# Patient Record
Sex: Female | Born: 1950 | Race: Black or African American | Hispanic: No | Marital: Married | Smoking: Never smoker
Health system: Southern US, Community
[De-identification: ages and names within clinical notes are randomized; demographics above are authoritative.]

## PROBLEM LIST (undated history)

## (undated) DIAGNOSIS — I10 Essential (primary) hypertension: Secondary | ICD-10-CM

## (undated) DIAGNOSIS — E119 Type 2 diabetes mellitus without complications: Secondary | ICD-10-CM

## (undated) DIAGNOSIS — E785 Hyperlipidemia, unspecified: Secondary | ICD-10-CM

## (undated) DIAGNOSIS — M1990 Unspecified osteoarthritis, unspecified site: Secondary | ICD-10-CM

## (undated) HISTORY — PX: ABDOMINAL HYSTERECTOMY: SHX81

## (undated) HISTORY — DX: Type 2 diabetes mellitus without complications: E11.9

## (undated) HISTORY — DX: Essential (primary) hypertension: I10

## (undated) HISTORY — DX: Hyperlipidemia, unspecified: E78.5

---

## 1998-01-21 ENCOUNTER — Other Ambulatory Visit: Admission: RE | Admit: 1998-01-21 | Discharge: 1998-01-21 | Payer: Self-pay | Admitting: Obstetrics

## 1999-01-15 ENCOUNTER — Other Ambulatory Visit: Admission: RE | Admit: 1999-01-15 | Discharge: 1999-01-15 | Payer: Self-pay | Admitting: Obstetrics

## 1999-07-23 ENCOUNTER — Encounter: Payer: Self-pay | Admitting: Family Medicine

## 1999-07-23 ENCOUNTER — Ambulatory Visit (HOSPITAL_COMMUNITY): Admission: RE | Admit: 1999-07-23 | Discharge: 1999-07-23 | Payer: Self-pay | Admitting: Family Medicine

## 1999-11-09 ENCOUNTER — Encounter: Payer: Self-pay | Admitting: Obstetrics

## 1999-11-09 ENCOUNTER — Encounter: Admission: RE | Admit: 1999-11-09 | Discharge: 1999-11-09 | Payer: Self-pay | Admitting: Obstetrics

## 2001-01-12 ENCOUNTER — Encounter: Admission: RE | Admit: 2001-01-12 | Discharge: 2001-01-12 | Payer: Self-pay | Admitting: Family Medicine

## 2001-01-12 ENCOUNTER — Encounter: Payer: Self-pay | Admitting: Family Medicine

## 2003-01-23 ENCOUNTER — Encounter: Payer: Self-pay | Admitting: Family Medicine

## 2003-01-23 ENCOUNTER — Encounter: Admission: RE | Admit: 2003-01-23 | Discharge: 2003-01-23 | Payer: Self-pay | Admitting: Family Medicine

## 2004-08-17 ENCOUNTER — Ambulatory Visit (HOSPITAL_COMMUNITY): Admission: RE | Admit: 2004-08-17 | Discharge: 2004-08-17 | Payer: Self-pay | Admitting: Gastroenterology

## 2004-09-08 ENCOUNTER — Encounter: Admission: RE | Admit: 2004-09-08 | Discharge: 2004-09-08 | Payer: Self-pay | Admitting: Family Medicine

## 2006-01-19 ENCOUNTER — Encounter: Admission: RE | Admit: 2006-01-19 | Discharge: 2006-01-19 | Payer: Self-pay | Admitting: Family Medicine

## 2007-01-23 ENCOUNTER — Encounter: Admission: RE | Admit: 2007-01-23 | Discharge: 2007-01-23 | Payer: Self-pay | Admitting: Family Medicine

## 2007-09-05 ENCOUNTER — Encounter: Admission: RE | Admit: 2007-09-05 | Discharge: 2007-09-05 | Payer: Self-pay | Admitting: Sports Medicine

## 2010-01-07 ENCOUNTER — Encounter: Admission: RE | Admit: 2010-01-07 | Discharge: 2010-01-07 | Payer: Self-pay | Admitting: Family Medicine

## 2010-12-11 NOTE — Op Note (Signed)
NAMECOLLIN, Shelby Duke                 ACCOUNT NO.:  [id number]   MEDICAL RECORD NO.:  [id number]          PATIENT TYPE:  AMB   LOCATION:  ENDO                         FACILITY:  [facility]   PHYSICIAN:  Anselmo Rod, M.D.  DATE OF BIRTH:  29-May-1951   DATE OF PROCEDURE:  08/17/2004  DATE OF DISCHARGE:                                 OPERATIVE REPORT   PROCEDURE PERFORMED:  Screening colonoscopy.   ENDOSCOPIST:  Anselmo Rod, M.D.   INSTRUMENT USED:  Olympus video colonoscope.   INDICATIONS FOR PROCEDURE:  A 60 year old white female underwent screening  colonoscopy to rule out colonic polyps, masses, etc.   PREPROCEDURE PREPARATION:  Informed consent was procured from the patient.  The risks and benefits of the procedure including a 10% missed rate of  cancer and polyps were discussed with the patient.  The patient was prepped  with a bottle of magnesium citrate and a gallon of GoLYTELY the night prior  to the procedure.   PREPROCEDURE PHYSICAL:  VITAL SIGNS:  The patient had stable vital signs.  NECK:  Supple.  CHEST: Clear to auscultation.  S1 and S2 regular.  ABDOMEN: Soft with normal bowel sounds.  The patient had an obese abdomen  with a midline surgical scar present at the umbilicus from previous  hysterectomy.   DESCRIPTION OF PROCEDURE:  The patient was placed in the left lateral  decubitus position, sedated with 50 mg of Demerol and 5 mg of Versed in slow  incremental doses.  Once the patient was adequately sedated and maintained  on low flow oxygen and continuous cardiac monitoring, the Olympus video  colonoscope was advanced from the rectum to the cecum with difficulty.  The  patient had a very tortuous colon.  The patient's position was changed from  the left lateral to the supine and the right lateral position and again back  onto the left lateral side to reach the cecum with gentle application of  abdominal pressure.  No masses, polyps, erosions, ulcerations, or  diverticula were seen.  The appendiceal orifice and the ileocecal valve were  clearly visualized and photographed.  The patient tolerated the procedure  well without complications. Retroflexion in the rectum revealed no  abnormalities.   IMPRESSION:  Tortuous colon otherwise unrevealing colonic mucosa.  No  masses, polyps or diverticula seen.   RECOMMENDATIONS:  1.  Continue a high fiber diet with liberal fluid intake.  2.  Repeat colonoscopy in the next 10 years unless the patient develops any      abnormal symptoms in the interim.  3.  Outpatient follow up as need arises in the future.                                               ______________________________  Anselmo Rod, M.D.  Electronically Signed 08/17/2004 11:23:59am EST    JNM/MEDQ  D:  08/17/2004  T:  08/17/2004  Job:  [id number]   cc:  Renaye Rakers, M.D.  [phone] [address], [address]  [place]  [place] [address]  Fax: [phone]

## 2010-12-23 ENCOUNTER — Other Ambulatory Visit: Payer: Self-pay | Admitting: Family Medicine

## 2010-12-23 DIAGNOSIS — Z1231 Encounter for screening mammogram for malignant neoplasm of breast: Secondary | ICD-10-CM

## 2011-01-11 ENCOUNTER — Ambulatory Visit
Admission: RE | Admit: 2011-01-11 | Discharge: 2011-01-11 | Disposition: A | Discharge status: Home / Self Care | Payer: BC Managed Care – PPO | Source: Ambulatory Visit | Attending: Family Medicine | Admitting: Family Medicine

## 2011-01-11 DIAGNOSIS — Z1231 Encounter for screening mammogram for malignant neoplasm of breast: Secondary | ICD-10-CM

## 2012-02-23 ENCOUNTER — Other Ambulatory Visit: Payer: Self-pay | Admitting: Family Medicine

## 2012-02-23 DIAGNOSIS — Z1231 Encounter for screening mammogram for malignant neoplasm of breast: Secondary | ICD-10-CM

## 2012-03-07 ENCOUNTER — Other Ambulatory Visit: Payer: Self-pay | Admitting: Family Medicine

## 2012-03-07 ENCOUNTER — Ambulatory Visit: Payer: BC Managed Care – PPO

## 2012-03-07 ENCOUNTER — Ambulatory Visit
Admission: RE | Admit: 2012-03-07 | Discharge: 2012-03-07 | Disposition: A | Discharge status: Home / Self Care | Payer: BC Managed Care – PPO | Source: Ambulatory Visit | Attending: Family Medicine | Admitting: Family Medicine

## 2012-03-07 DIAGNOSIS — Z1231 Encounter for screening mammogram for malignant neoplasm of breast: Secondary | ICD-10-CM

## 2013-01-07 ENCOUNTER — Ambulatory Visit (INDEPENDENT_AMBULATORY_CARE_PROVIDER_SITE_OTHER): Payer: BC Managed Care – PPO | Admitting: Internal Medicine

## 2013-01-07 VITALS — BP 138/80 | HR 78 | Temp 97.9°F | Resp 16 | Ht 60.0 in | Wt 245.0 lb

## 2013-01-07 DIAGNOSIS — M25549 Pain in joints of unspecified hand: Secondary | ICD-10-CM

## 2013-01-07 DIAGNOSIS — Z23 Encounter for immunization: Secondary | ICD-10-CM

## 2013-01-07 DIAGNOSIS — S61209A Unspecified open wound of unspecified finger without damage to nail, initial encounter: Secondary | ICD-10-CM

## 2013-01-07 DIAGNOSIS — S61012A Laceration without foreign body of left thumb without damage to nail, initial encounter: Secondary | ICD-10-CM

## 2013-01-07 NOTE — Progress Notes (Signed)
   Patient ID: ZONIA CAPLIN MRN: [id number], DOB: 1951-06-12, 62 y.o. Date of Encounter: 01/07/2013, 6:27 PM   PROCEDURE NOTE: Verbal consent obtained.  Risks and benefits of the procedure were explained. Patient made an informed decision to proceed with the procedure. Sterile technique employed. Numbing: Anesthesia obtained with 1% plain lidocaine with 0.5% Marcaine 1:1 ratio 4 cc for digital block.   Cleansed with soap and water. Irrigated. Betadine prep per usual protocol.  Wound explored, no deep structures involved, no foreign bodies.   Wound repaired with # 12 simple interrupted sutures of 5-0 Prolene.  Hemostasis obtained. Wound cleansed and dressed.  Wound care instructions including precautions covered with patient. Handout given.  Anticipate suture removal in 10 days.  Metal fold over given.   SignedEula Listen, PA-C 01/07/2013 6:27 PM

## 2013-01-07 NOTE — Progress Notes (Signed)
  Subjective:    Patient ID: Shelby Duke, female    DOB: [birth year], 62 y.o.   MRN: [id number]  HPI At home in kitchen and knife cut lateral thumb. No numbness,no loss of rom or strength. Needs tetanus  Review of Systems     Objective:   Physical Exam  Vitals reviewed. Constitutional: She appears well-developed and well-nourished. She appears distressed.  Eyes: EOM are normal.  Skin: Laceration noted.  Wound left lateral thumb, over 3.3 cm No nerve vessel or tendon injury    See Eula Listen PAc repair      Assessment & Plan:  Dressing wound care/Tdap

## 2013-01-16 ENCOUNTER — Ambulatory Visit (INDEPENDENT_AMBULATORY_CARE_PROVIDER_SITE_OTHER): Payer: BC Managed Care – PPO | Admitting: Physician Assistant

## 2013-01-16 DIAGNOSIS — Z4802 Encounter for removal of sutures: Secondary | ICD-10-CM

## 2013-01-16 NOTE — Progress Notes (Signed)
Sutures removed by Elease Etienne LPN as noted below.  I did not examine the patient personally.  I was asked to close the encounter after the sutures had been removed and the patient had already been discharged.

## 2013-01-16 NOTE — Progress Notes (Signed)
Patient comes in today for suture removal. Pt cut her left thumb while using a knife to open a jar on 6/15. She had 12 stitches. Area clean, dry and well healing. No drainage or discharge noted. No swelling or erythema. 12 stitches removed. Patient tolerated well. Applied 3 steri strips to the wound close to the joint for extra reinforcement. Gave pt wound care instructions.

## 2013-05-03 ENCOUNTER — Other Ambulatory Visit: Payer: Self-pay | Admitting: Family Medicine

## 2013-05-03 ENCOUNTER — Other Ambulatory Visit: Payer: Self-pay

## 2013-05-03 DIAGNOSIS — Z1231 Encounter for screening mammogram for malignant neoplasm of breast: Secondary | ICD-10-CM

## 2013-05-16 ENCOUNTER — Ambulatory Visit
Admission: RE | Admit: 2013-05-16 | Discharge: 2013-05-16 | Disposition: A | Discharge status: Home / Self Care | Payer: BC Managed Care – PPO | Source: Ambulatory Visit

## 2013-05-16 DIAGNOSIS — Z1231 Encounter for screening mammogram for malignant neoplasm of breast: Secondary | ICD-10-CM

## 2014-05-06 ENCOUNTER — Other Ambulatory Visit: Payer: Self-pay

## 2014-05-06 DIAGNOSIS — Z1231 Encounter for screening mammogram for malignant neoplasm of breast: Secondary | ICD-10-CM

## 2014-05-22 ENCOUNTER — Ambulatory Visit
Admission: RE | Admit: 2014-05-22 | Discharge: 2014-05-22 | Disposition: A | Discharge status: Home / Self Care | Payer: BC Managed Care – PPO | Source: Ambulatory Visit

## 2014-05-22 ENCOUNTER — Other Ambulatory Visit: Payer: Self-pay

## 2014-05-22 ENCOUNTER — Ambulatory Visit
Admission: RE | Admit: 2014-05-22 | Discharge: 2014-05-22 | Disposition: A | Discharge status: Home / Self Care | Payer: Medicare PPO | Source: Ambulatory Visit

## 2014-05-22 DIAGNOSIS — Z1231 Encounter for screening mammogram for malignant neoplasm of breast: Secondary | ICD-10-CM

## 2015-04-18 DIAGNOSIS — E669 Obesity, unspecified: Secondary | ICD-10-CM | POA: Diagnosis not present

## 2015-04-18 DIAGNOSIS — E780 Pure hypercholesterolemia: Secondary | ICD-10-CM | POA: Diagnosis not present

## 2015-04-18 DIAGNOSIS — E1169 Type 2 diabetes mellitus with other specified complication: Secondary | ICD-10-CM | POA: Diagnosis not present

## 2015-04-18 DIAGNOSIS — I10 Essential (primary) hypertension: Secondary | ICD-10-CM | POA: Diagnosis not present

## 2015-04-25 ENCOUNTER — Other Ambulatory Visit: Payer: Self-pay

## 2015-04-25 DIAGNOSIS — Z1231 Encounter for screening mammogram for malignant neoplasm of breast: Secondary | ICD-10-CM

## 2015-05-21 DIAGNOSIS — D485 Neoplasm of uncertain behavior of skin: Secondary | ICD-10-CM | POA: Diagnosis not present

## 2015-06-03 ENCOUNTER — Ambulatory Visit
Admission: RE | Admit: 2015-06-03 | Discharge: 2015-06-03 | Disposition: A | Discharge status: Home / Self Care | Payer: Medicare PPO | Source: Ambulatory Visit

## 2015-06-03 DIAGNOSIS — Z1231 Encounter for screening mammogram for malignant neoplasm of breast: Secondary | ICD-10-CM | POA: Diagnosis not present

## 2015-07-08 DIAGNOSIS — D485 Neoplasm of uncertain behavior of skin: Secondary | ICD-10-CM | POA: Diagnosis not present

## 2015-07-08 DIAGNOSIS — L570 Actinic keratosis: Secondary | ICD-10-CM | POA: Diagnosis not present

## 2015-07-08 DIAGNOSIS — L720 Epidermal cyst: Secondary | ICD-10-CM | POA: Diagnosis not present

## 2015-07-27 HISTORY — PX: EYE SURGERY: SHX253

## 2016-06-29 ENCOUNTER — Other Ambulatory Visit: Payer: Self-pay | Admitting: Family Medicine

## 2016-06-29 DIAGNOSIS — Z1231 Encounter for screening mammogram for malignant neoplasm of breast: Secondary | ICD-10-CM

## 2016-07-20 ENCOUNTER — Ambulatory Visit
Admission: RE | Admit: 2016-07-20 | Discharge: 2016-07-20 | Disposition: A | Discharge status: Home / Self Care | Payer: Medicare Other | Source: Ambulatory Visit | Attending: Family Medicine | Admitting: Family Medicine

## 2016-07-20 DIAGNOSIS — Z1231 Encounter for screening mammogram for malignant neoplasm of breast: Secondary | ICD-10-CM

## 2016-12-10 NOTE — Progress Notes (Signed)
Please place orders in EPIC as patient is being scheduled for a pre-op appointment! Thank you! 

## 2016-12-24 ENCOUNTER — Encounter (HOSPITAL_COMMUNITY): Payer: Self-pay

## 2016-12-24 ENCOUNTER — Other Ambulatory Visit (HOSPITAL_COMMUNITY): Payer: Self-pay | Admitting: Emergency Medicine

## 2016-12-24 NOTE — Patient Instructions (Addendum)
Gertie ExonMarsha S Whittier  12/24/2016   Your procedure is scheduled on: 01-05-17  [facility]  [place] to 3rd floor to  Short Stay Center [facility].   Call this number if you have problems the morning of surgery [phone]    Remember: ONLY 1 PERSON MAY GO WITH YOU TO SHORT STAY TO GET  READY MORNING OF YOUR SURGERY.  Do not eat food or drink liquids :After Midnight.     Take these medicines the morning of surgery with A SIP OF WATER: ROSUVASTATIN(CRESTOR)  DO NOT TAKE ANY DIABETIC MEDICATIONS DAY OF YOUR SURGERY                               You may not have any metal on your body including hair pins and              piercings  Do not wear jewelry, make-up, lotions, powders or perfumes, deodorant             Do not wear nail polish.  Do not shave  48 hours prior to surgery.     Do not bring valuables to the hospital. [facility] IS NOT             RESPONSIBLE   FOR VALUABLES.  Contacts, dentures or bridgework may not be worn into surgery.  Leave suitcase in the car. After surgery it may be brought to your room.                 Please read over the following fact sheets you were given: _____________________________________________________________________             How to Manage Your Diabetes Before and After Surgery  Why is it important to control my blood sugar before and after surgery? . Improving blood sugar levels before and after surgery helps healing and can limit problems. . A way of improving blood sugar control is eating a healthy diet by: o  Eating less sugar and carbohydrates o  Increasing activity/exercise o  Talking with your doctor about reaching your blood sugar goals . High blood sugars (greater than 180 mg/dL) can raise your risk of infections and slow your recovery, so you will need to focus on controlling your diabetes during the weeks before surgery. . Make sure that the doctor who takes care of your diabetes knows about  your planned surgery including the date and location.    WHAT DO I DO ABOUT MY DIABETES MEDICATION?   . THE NIGHT BEFORE SURGERY  01-04-17,  TAKE SITAGLIPTIN-METFORMIN(JANUMET) AS USUAL    . THE MORNING OF SURGERY 01-05-17, DO NOT TAKE ANY DIABETES MEDICATION   Patient Signature:  Date:   Nurse Signature:  Date:    Reviewed and Endorsed by [facility] Patient Education Committee, August 2015  Incentive Spirometer  An incentive spirometer is a tool that can help keep your lungs clear and active. This tool measures how well you are filling your lungs with each breath. Taking long deep breaths may help reverse or decrease the chance of developing breathing (pulmonary) problems (especially infection) following:  A long period of time when you are unable to move or be active. BEFORE THE PROCEDURE   If the spirometer includes an indicator to show your best effort, your nurse or respiratory therapist will set it to a desired goal.  If possible, sit up straight or  lean slightly forward. Try not to slouch.  Hold the incentive spirometer in an upright position. INSTRUCTIONS FOR USE  1. Sit on the edge of your bed if possible, or sit up as far as you can in bed or on a chair. 2. Hold the incentive spirometer in an upright position. 3. Breathe out normally. 4. Place the mouthpiece in your mouth and seal your lips tightly around it. 5. Breathe in slowly and as deeply as possible, raising the piston or the ball toward the top of the column. 6. Hold your breath for 3-5 seconds or for as long as possible. Allow the piston or ball to fall to the bottom of the column. 7. Remove the mouthpiece from your mouth and breathe out normally. 8. Rest for a few seconds and repeat Steps 1 through 7 at least 10 times every 1-2 hours when you are awake. Take your time and take a few normal breaths between deep breaths. 9. The spirometer may include an indicator to show your best effort. Use the indicator as  a goal to work toward during each repetition. 10. After each set of 10 deep breaths, practice coughing to be sure your lungs are clear. If you have an incision (the cut made at the time of surgery), support your incision when coughing by placing a pillow or rolled up towels firmly against it. Once you are able to get out of bed, walk around indoors and cough well. You may stop using the incentive spirometer when instructed by your caregiver.  RISKS AND COMPLICATIONS  Take your time so you do not get dizzy or light-headed.  If you are in pain, you may need to take or ask for pain medication before doing incentive spirometry. It is harder to take a deep breath if you are having pain. AFTER USE  Rest and breathe slowly and easily.  It can be helpful to keep track of a log of your progress. Your caregiver can provide you with a simple table to help with this. If you are using the spirometer at home, follow these instructions: SEEK MEDICAL CARE IF:   You are having difficultly using the spirometer.  You have trouble using the spirometer as often as instructed.  Your pain medication is not giving enough relief while using the spirometer.  You develop fever of 100.5 F (38.1 C) or higher. SEEK IMMEDIATE MEDICAL CARE IF:   You cough up bloody sputum that had not been present before.  You develop fever of 102 F (38.9 C) or greater.  You develop worsening pain at or near the incision site. MAKE SURE YOU:   Understand these instructions.  Will watch your condition.  Will get help right away if you are not doing well or get worse. Document Released: 11/22/2006 Document Revised: 10/04/2011 Document Reviewed: 01/23/2007 ExitCare Patient Information 2014 [place] [employer].   ________________________________________________________________________               [facility] - Preparing for Surgery Before surgery, you can play an important role.  Because skin is not sterile, your skin  needs to be as free of germs as possible.  You can reduce the number of germs on your skin by washing with CHG (chlorahexidine gluconate) soap before surgery.  CHG is an antiseptic cleaner which kills germs and bonds with the skin to continue killing germs even after washing. Please DO NOT use if you have an allergy to CHG or antibacterial soaps.  If your skin becomes reddened/irritated stop  using the CHG and inform your nurse when you arrive at Short Stay. Do not shave (including legs and underarms) for at least 48 hours prior to the first CHG shower.  You may shave your face/neck. Please follow these instructions carefully:  1.  Shower with CHG Soap the night before surgery and the  morning of Surgery.  2.  If you choose to wash your hair, wash your hair first as usual with your  normal  shampoo.  3.  After you shampoo, rinse your hair and body thoroughly to remove the  shampoo.                           4.  Use CHG as you would any other liquid soap.  You can apply chg directly  to the skin and wash                       Gently with a scrungie or clean washcloth.  5.  Apply the CHG Soap to your body ONLY FROM THE NECK DOWN.   Do not use on face/ open                           Wound or open sores. Avoid contact with eyes, ears mouth and genitals (private parts).                       Wash face,  Genitals (private parts) with your normal soap.             6.  Wash thoroughly, paying special attention to the area where your surgery  will be performed.  7.  Thoroughly rinse your body with warm water from the neck down.  8.  DO NOT shower/wash with your normal soap after using and rinsing off  the CHG Soap.                9.  Pat yourself dry with a clean towel.            10.  Wear clean pajamas.            11.  Place clean sheets on your bed the night of your first shower and do not  sleep with pets. Day of Surgery : Do not apply any lotions/deodorants the morning of surgery.  Please wear clean  clothes to the hospital/surgery center.  FAILURE TO FOLLOW THESE INSTRUCTIONS MAY RESULT IN THE CANCELLATION OF YOUR SURGERY PATIENT SIGNATURE_________________________________  NURSE SIGNATURE__________________________________  ________________________________________________________________________

## 2016-12-27 ENCOUNTER — Encounter (INDEPENDENT_AMBULATORY_CARE_PROVIDER_SITE_OTHER): Payer: Self-pay

## 2016-12-27 ENCOUNTER — Encounter (HOSPITAL_COMMUNITY)
Admission: RE | Admit: 2016-12-27 | Discharge: 2016-12-27 | Disposition: A | Discharge status: Home / Self Care | Payer: Medicare Other | Source: Ambulatory Visit | Attending: Orthopedic Surgery | Admitting: Orthopedic Surgery

## 2016-12-27 ENCOUNTER — Encounter (HOSPITAL_COMMUNITY): Payer: Self-pay

## 2016-12-27 DIAGNOSIS — Z01812 Encounter for preprocedural laboratory examination: Secondary | ICD-10-CM | POA: Diagnosis present

## 2016-12-27 HISTORY — DX: Unspecified osteoarthritis, unspecified site: M19.90

## 2016-12-27 LAB — ABO/RH: ABO/RH(D): O POS

## 2016-12-27 LAB — URINALYSIS, ROUTINE W REFLEX MICROSCOPIC
Bacteria, UA: NONE SEEN
Bilirubin Urine: NEGATIVE
Glucose, UA: NEGATIVE mg/dL
Hgb urine dipstick: NEGATIVE
Ketones, ur: NEGATIVE mg/dL
Nitrite: NEGATIVE
Protein, ur: NEGATIVE mg/dL
Specific Gravity, Urine: 1.017 (ref 1.005–1.030)
pH: 5 (ref 5.0–8.0)

## 2016-12-27 LAB — PROTIME-INR
INR: 0.96
Prothrombin Time: 12.8 seconds (ref 11.4–15.2)

## 2016-12-27 LAB — GLUCOSE, CAPILLARY: GLUCOSE-CAPILLARY: 110 mg/dL — AB (ref 65–99)

## 2016-12-27 LAB — APTT: aPTT: 36 seconds (ref 24–36)

## 2016-12-27 LAB — SURGICAL PCR SCREEN
MRSA, PCR: NEGATIVE
STAPHYLOCOCCUS AUREUS: NEGATIVE

## 2016-12-27 NOTE — Progress Notes (Addendum)
Surgical clearance Dr Parke SimmersBland on chart   CBCdiff, CMP, lipid panel, HgA1c 12-06-16 on chart

## 2016-12-27 NOTE — Progress Notes (Signed)
RN called and [facility] at [facility] requesting EKG records from May 2018

## 2016-12-29 NOTE — Progress Notes (Signed)
EKG 12-08-16 on chart

## 2017-01-04 NOTE — H&P (Signed)
TOTAL KNEE ADMISSION H&P  Patient is being admitted for left total knee arthroplasty.  Subjective:  Chief Complaint:left knee pain.  HPI: Shelby Duke, 66 y.o. female, has a history of pain and functional disability in the left knee due to arthritis and has failed non-surgical conservative treatments for greater than 12 weeks to includeNSAID's and/or analgesics, flexibility and strengthening excercises, use of assistive devices and activity modification.  Onset of symptoms was gradual, starting >10 years ago with gradually worsening course since that time. The patient noted no past surgery on the left knee(s).  Patient currently rates pain in the left knee(s) at 8 out of 10 with activity. Patient has night pain, worsening of pain with activity and weight bearing, pain that interferes with activities of daily living, pain with passive range of motion, crepitus and joint swelling.  Patient has evidence of periarticular osteophytes and joint space narrowing by imaging studies. There is no active infection.  Past Medical History:  Diagnosis Date  . Arthritis   . Diabetes mellitus without complication (HCC)    type 2  . Hyperlipidemia   . Hypertension     Past Surgical History:  Procedure Laterality Date  . ABDOMINAL HYSTERECTOMY    . CESAREAN SECTION     x1  . EYE SURGERY  2017   lasik      Current Outpatient Prescriptions:  .  amLODipine (NORVASC) 10 MG tablet, Take 10 mg by mouth daily at 6 PM. Between 1700-1800, Disp: , Rfl:  .  Menthol-Camphor 3-3 % GEL, Apply 1 application topically 4 (four) times daily as needed (for knee pain.). Sore No More Warm Therapy Natural Pain Relieving Gel, Disp: , Rfl:  .  rosuvastatin (CRESTOR) 10 MG tablet, Take 10 mg by mouth daily., Disp: , Rfl:  .  SitaGLIPtin-MetFORMIN HCl (JANUMET XR) [phone] MG TB24, Take 1 tablet by mouth daily at 6 PM. 1600-1700, Disp: , Rfl:  .  telmisartan (MICARDIS) 80 MG tablet, Take 80 mg by mouth daily at 6 PM. Between  1700-1800, Disp: , Rfl:  .  Vitamin D, Ergocalciferol, (DRISDOL) 50000 units CAPS capsule, Take 50,000 Units by mouth every Sunday., Disp: , Rfl:   Allergies  Allergen Reactions  . Aspirin Nausea Only and Other (See Comments)    Acid indigestion    Social History  Substance Use Topics  . Smoking status: Never Smoker  . Smokeless tobacco: Never Used  . Alcohol use No    Family History  Problem Relation Age of Onset  . Diabetes Mother   . Diabetes Father      Review of Systems  Constitutional: Negative.   HENT: Negative.   Eyes: Negative.   Respiratory: Negative.   Cardiovascular: Negative.   Gastrointestinal: Negative.   Genitourinary: Positive for frequency. Negative for dysuria, flank pain, hematuria and urgency.  Musculoskeletal: Positive for joint pain and myalgias. Negative for back pain, falls and neck pain.  Skin: Negative.   Neurological: Negative.   Endo/Heme/Allergies: Negative.   Psychiatric/Behavioral: Negative.     Objective:  Physical Exam  Constitutional: She is oriented to person, place, and time. She appears well-developed. No distress.  Morbidly obese  HENT:  Head: Normocephalic and atraumatic.  Right Ear: External ear normal.  Left Ear: External ear normal.  Nose: Nose normal.  Mouth/Throat: Oropharynx is clear and moist.  Eyes: Conjunctivae and EOM are normal.  Neck: Normal range of motion. Neck supple.  Cardiovascular: Normal rate, regular rhythm, normal heart sounds and intact distal pulses.  No murmur heard. Respiratory: Effort normal and breath sounds normal. No respiratory distress. She has no wheezes.  GI: Soft. Bowel sounds are normal. She exhibits no distension. There is no tenderness.  Musculoskeletal:       Right hip: Normal.       Left hip: Normal.       Right knee: She exhibits decreased range of motion and swelling. She exhibits no effusion and no erythema. Tenderness found. Medial joint line tenderness noted. No lateral joint  line tenderness noted.       Left knee: She exhibits decreased range of motion and swelling. She exhibits no effusion and no erythema. Tenderness found. Medial joint line and lateral joint line tenderness noted.  Neurological: She is alert and oriented to person, place, and time. She has normal strength. No sensory deficit.  Skin: No rash noted. She is not diaphoretic. No erythema.  Psychiatric: She has a normal mood and affect. Her behavior is normal.    Vitals  Weight: 229 lb Height: 62in Body Surface Area: 2.02 m Body Mass Index: 41.88 kg/m  Pulse: 80 (Regular)  BP: 130/78 (Sitting, Left Arm, Standard)  Imaging Review Plain radiographs demonstrate severe degenerative joint disease of the left knee(s). The overall alignment issignificant varus. The bone quality appears to be good for age and reported activity level.  Assessment/Plan:  End stage primary osteoarthritis, left knee   The patient history, physical examination, clinical judgment of the provider and imaging studies are consistent with end stage degenerative joint disease of the left knee(s) and total knee arthroplasty is deemed medically necessary. The treatment options including medical management, injection therapy arthroscopy and arthroplasty were discussed at length. The risks and benefits of total knee arthroplasty were presented and reviewed. The risks due to aseptic loosening, infection, stiffness, patella tracking problems, thromboembolic complications and other imponderables were discussed. The patient acknowledged the explanation, agreed to proceed with the plan and consent was signed. Patient is being admitted for inpatient treatment for surgery, pain control, PT, OT, prophylactic antibiotics, VTE prophylaxis, progressive ambulation and ADL's and discharge planning. The patient is planning to be discharged home with home health services    PCP: Dr. Parke SimmersBland Home with husband Therapy Plans: HHPT [facility] D[facility] needs: walker and 3-n-1 Other concerns: will take Xarelto as opposed to aspirin due to allergy   Dimitri PedAmber Jamielee Mchale, PA-C

## 2017-01-05 ENCOUNTER — Inpatient Hospital Stay (HOSPITAL_COMMUNITY)
Admission: RE | Admit: 2017-01-05 | Discharge: 2017-01-07 | DRG: 470 | Disposition: A | Discharge status: Home Health | Payer: Medicare Other | Source: Ambulatory Visit | Attending: Orthopedic Surgery | Admitting: Orthopedic Surgery

## 2017-01-05 ENCOUNTER — Inpatient Hospital Stay (HOSPITAL_COMMUNITY): Payer: Medicare Other | Admitting: Certified Registered Nurse Anesthetist

## 2017-01-05 ENCOUNTER — Encounter (HOSPITAL_COMMUNITY)
Admission: RE | Disposition: A | Discharge status: Home Health | Payer: Self-pay | Source: Ambulatory Visit | Attending: Orthopedic Surgery

## 2017-01-05 ENCOUNTER — Encounter (HOSPITAL_COMMUNITY): Payer: Self-pay | Admitting: *Deleted

## 2017-01-05 DIAGNOSIS — Z6841 Body Mass Index (BMI) 40.0 and over, adult: Secondary | ICD-10-CM

## 2017-01-05 DIAGNOSIS — E785 Hyperlipidemia, unspecified: Secondary | ICD-10-CM | POA: Diagnosis present

## 2017-01-05 DIAGNOSIS — I10 Essential (primary) hypertension: Secondary | ICD-10-CM | POA: Diagnosis present

## 2017-01-05 DIAGNOSIS — Z886 Allergy status to analgesic agent status: Secondary | ICD-10-CM | POA: Diagnosis not present

## 2017-01-05 DIAGNOSIS — M1712 Unilateral primary osteoarthritis, left knee: Principal | ICD-10-CM | POA: Diagnosis present

## 2017-01-05 DIAGNOSIS — E119 Type 2 diabetes mellitus without complications: Secondary | ICD-10-CM | POA: Diagnosis present

## 2017-01-05 DIAGNOSIS — Z96652 Presence of left artificial knee joint: Secondary | ICD-10-CM

## 2017-01-05 DIAGNOSIS — M25562 Pain in left knee: Secondary | ICD-10-CM | POA: Diagnosis present

## 2017-01-05 HISTORY — PX: TOTAL KNEE ARTHROPLASTY: SHX125

## 2017-01-05 LAB — TYPE AND SCREEN
ABO/RH(D): O POS
Antibody Screen: NEGATIVE

## 2017-01-05 LAB — GLUCOSE, CAPILLARY
GLUCOSE-CAPILLARY: 112 mg/dL — AB (ref 65–99)
GLUCOSE-CAPILLARY: 187 mg/dL — AB (ref 65–99)
Glucose-Capillary: 115 mg/dL — ABNORMAL HIGH (ref 65–99)
Glucose-Capillary: 184 mg/dL — ABNORMAL HIGH (ref 65–99)

## 2017-01-05 SURGERY — ARTHROPLASTY, KNEE, TOTAL
Anesthesia: Monitor Anesthesia Care | Site: Knee | Laterality: Left

## 2017-01-05 MED ORDER — TRANEXAMIC ACID 1000 MG/10ML IV SOLN
1000.0000 mg | INTRAVENOUS | Status: AC
  Administered 2017-01-05: 1000 mg via INTRAVENOUS
  Filled 2017-01-05: qty 1100

## 2017-01-05 MED ORDER — IRBESARTAN 75 MG PO TABS
37.5000 mg | ORAL_TABLET | Freq: Every day | ORAL | Status: DC
  Administered 2017-01-06: 37.5 mg via ORAL
  Filled 2017-01-05: qty 1

## 2017-01-05 MED ORDER — INSULIN ASPART 100 UNIT/ML ~~LOC~~ SOLN
0.0000 [IU] | Freq: Three times a day (TID) | SUBCUTANEOUS | Status: DC
  Administered 2017-01-06: 2 [IU] via SUBCUTANEOUS
  Administered 2017-01-06: 3 [IU] via SUBCUTANEOUS
  Administered 2017-01-07: 2 [IU] via SUBCUTANEOUS

## 2017-01-05 MED ORDER — PROPOFOL 10 MG/ML IV BOLUS
INTRAVENOUS | Status: AC
  Filled 2017-01-05: qty 20

## 2017-01-05 MED ORDER — FERROUS SULFATE 325 (65 FE) MG PO TABS
325.0000 mg | ORAL_TABLET | Freq: Three times a day (TID) | ORAL | Status: DC
  Administered 2017-01-06 (×2): 325 mg via ORAL
  Filled 2017-01-05 (×2): qty 1

## 2017-01-05 MED ORDER — CHLORHEXIDINE GLUCONATE 4 % EX LIQD: 60.0000 mL | Freq: Once | CUTANEOUS | Status: DC

## 2017-01-05 MED ORDER — LIDOCAINE 2% (20 MG/ML) 5 ML SYRINGE
INTRAMUSCULAR | Status: AC
  Filled 2017-01-05: qty 5

## 2017-01-05 MED ORDER — PROPOFOL 500 MG/50ML IV EMUL
INTRAVENOUS | Status: DC | PRN
  Administered 2017-01-05: 100 ug/kg/min via INTRAVENOUS

## 2017-01-05 MED ORDER — OXYCODONE HCL 5 MG PO TABS: 5.0000 mg | ORAL_TABLET | Freq: Once | ORAL | Status: DC | PRN

## 2017-01-05 MED ORDER — EPHEDRINE SULFATE 50 MG/ML IJ SOLN
INTRAMUSCULAR | Status: DC | PRN
  Administered 2017-01-05: 5 mg via INTRAVENOUS

## 2017-01-05 MED ORDER — EPHEDRINE 5 MG/ML INJ
INTRAVENOUS | Status: AC
  Filled 2017-01-05: qty 10

## 2017-01-05 MED ORDER — METOCLOPRAMIDE HCL 5 MG/ML IJ SOLN
10.0000 mg | Freq: Once | INTRAMUSCULAR | Status: AC
  Administered 2017-01-05: 10 mg via INTRAVENOUS
  Filled 2017-01-05: qty 2

## 2017-01-05 MED ORDER — LACTATED RINGERS IV SOLN
INTRAVENOUS | Status: DC
  Administered 2017-01-05 (×2): via INTRAVENOUS

## 2017-01-05 MED ORDER — SODIUM CHLORIDE 0.9 % IR SOLN
Status: AC
  Filled 2017-01-05: qty 500000

## 2017-01-05 MED ORDER — BUPIVACAINE LIPOSOME 1.3 % IJ SUSP
INTRAMUSCULAR | Status: DC | PRN
  Administered 2017-01-05: 20 mL

## 2017-01-05 MED ORDER — MENTHOL 3 MG MT LOZG: 1.0000 | LOZENGE | OROMUCOSAL | Status: DC | PRN

## 2017-01-05 MED ORDER — PHENYLEPHRINE HCL 10 MG/ML IJ SOLN
INTRAMUSCULAR | Status: AC
  Filled 2017-01-05: qty 1

## 2017-01-05 MED ORDER — HYDROMORPHONE HCL 1 MG/ML IJ SOLN
0.5000 mg | INTRAMUSCULAR | Status: DC | PRN
  Administered 2017-01-05 (×2): 0.5 mg via INTRAVENOUS
  Filled 2017-01-05 (×2): qty 0.5

## 2017-01-05 MED ORDER — PROMETHAZINE HCL 25 MG/ML IJ SOLN: 6.2500 mg | INTRAMUSCULAR | Status: DC | PRN

## 2017-01-05 MED ORDER — DEXAMETHASONE SODIUM PHOSPHATE 10 MG/ML IJ SOLN
INTRAMUSCULAR | Status: DC | PRN
  Administered 2017-01-05: 10 mg via INTRAVENOUS

## 2017-01-05 MED ORDER — FENTANYL CITRATE (PF) 100 MCG/2ML IJ SOLN
INTRAMUSCULAR | Status: AC
  Filled 2017-01-05: qty 2

## 2017-01-05 MED ORDER — MIDAZOLAM HCL 5 MG/5ML IJ SOLN
INTRAMUSCULAR | Status: DC | PRN
  Administered 2017-01-05 (×2): 1 mg via INTRAVENOUS

## 2017-01-05 MED ORDER — CEFAZOLIN SODIUM-DEXTROSE 2-4 GM/100ML-% IV SOLN
2.0000 g | INTRAVENOUS | Status: AC
  Administered 2017-01-05: 2 g via INTRAVENOUS
  Filled 2017-01-05: qty 100

## 2017-01-05 MED ORDER — HYDROCODONE-ACETAMINOPHEN 5-325 MG PO TABS
1.0000 | ORAL_TABLET | ORAL | Status: DC | PRN
  Administered 2017-01-06 (×2): 2 via ORAL
  Administered 2017-01-06 (×2): 1 via ORAL
  Administered 2017-01-07 (×4): 2 via ORAL
  Filled 2017-01-05: qty 2
  Filled 2017-01-05: qty 1
  Filled 2017-01-05: qty 2
  Filled 2017-01-05: qty 1
  Filled 2017-01-05 (×4): qty 2

## 2017-01-05 MED ORDER — LIDOCAINE HCL (CARDIAC) 20 MG/ML IV SOLN
INTRAVENOUS | Status: DC | PRN
  Administered 2017-01-05: 20 mg via INTRAVENOUS

## 2017-01-05 MED ORDER — OXYCODONE HCL 5 MG/5ML PO SOLN: 5.0000 mg | Freq: Once | ORAL | Status: DC | PRN

## 2017-01-05 MED ORDER — FLEET ENEMA 7-19 GM/118ML RE ENEM: 1.0000 | ENEMA | Freq: Once | RECTAL | Status: DC | PRN

## 2017-01-05 MED ORDER — ONDANSETRON HCL 4 MG/2ML IJ SOLN
INTRAMUSCULAR | Status: DC | PRN
  Administered 2017-01-05: 4 mg via INTRAVENOUS

## 2017-01-05 MED ORDER — HYDROMORPHONE HCL 1 MG/ML IJ SOLN: 0.2500 mg | INTRAMUSCULAR | Status: DC | PRN

## 2017-01-05 MED ORDER — METHOCARBAMOL 500 MG PO TABS
500.0000 mg | ORAL_TABLET | Freq: Four times a day (QID) | ORAL | Status: DC | PRN
  Administered 2017-01-06 – 2017-01-07 (×5): 500 mg via ORAL
  Filled 2017-01-05 (×5): qty 1

## 2017-01-05 MED ORDER — PHENYLEPHRINE HCL 10 MG/ML IJ SOLN
INTRAVENOUS | Status: DC | PRN
  Administered 2017-01-05: 50 ug/min via INTRAVENOUS

## 2017-01-05 MED ORDER — ACETAMINOPHEN 325 MG PO TABS
650.0000 mg | ORAL_TABLET | Freq: Four times a day (QID) | ORAL | Status: DC | PRN
Start: 2017-01-05 — End: 2017-01-07

## 2017-01-05 MED ORDER — DEXAMETHASONE SODIUM PHOSPHATE 10 MG/ML IJ SOLN
INTRAMUSCULAR | Status: AC
  Filled 2017-01-05: qty 1

## 2017-01-05 MED ORDER — ACETAMINOPHEN 650 MG RE SUPP: 650.0000 mg | Freq: Four times a day (QID) | RECTAL | Status: DC | PRN

## 2017-01-05 MED ORDER — METHOCARBAMOL 1000 MG/10ML IJ SOLN
500.0000 mg | Freq: Four times a day (QID) | INTRAVENOUS | Status: DC | PRN
  Filled 2017-01-05: qty 5

## 2017-01-05 MED ORDER — BISACODYL 5 MG PO TBEC: 5.0000 mg | DELAYED_RELEASE_TABLET | Freq: Every day | ORAL | Status: DC | PRN

## 2017-01-05 MED ORDER — BUPIVACAINE LIPOSOME 1.3 % IJ SUSP
20.0000 mL | Freq: Once | INTRAMUSCULAR | Status: DC
  Filled 2017-01-05: qty 20

## 2017-01-05 MED ORDER — PROPOFOL 10 MG/ML IV BOLUS
INTRAVENOUS | Status: DC | PRN
  Administered 2017-01-05: 20 mg via INTRAVENOUS

## 2017-01-05 MED ORDER — AMLODIPINE BESYLATE 10 MG PO TABS
10.0000 mg | ORAL_TABLET | Freq: Every day | ORAL | Status: DC
  Administered 2017-01-06: 10 mg via ORAL
  Filled 2017-01-05: qty 1

## 2017-01-05 MED ORDER — PHENOL 1.4 % MT LIQD: 1.0000 | OROMUCOSAL | Status: DC | PRN

## 2017-01-05 MED ORDER — ROPIVACAINE HCL 5 MG/ML IJ SOLN
INTRAMUSCULAR | Status: DC | PRN
  Administered 2017-01-05: 20 mL via PERINEURAL

## 2017-01-05 MED ORDER — RIVAROXABAN 10 MG PO TABS
10.0000 mg | ORAL_TABLET | Freq: Every day | ORAL | Status: DC
  Administered 2017-01-06 – 2017-01-07 (×2): 10 mg via ORAL
  Filled 2017-01-05 (×2): qty 1

## 2017-01-05 MED ORDER — SODIUM CHLORIDE 0.9 % IR SOLN
Status: DC | PRN
  Administered 2017-01-05: 500 mL

## 2017-01-05 MED ORDER — BUPIVACAINE HCL (PF) 0.5 % IJ SOLN
INTRAMUSCULAR | Status: DC | PRN
  Administered 2017-01-05: 3 mL via INTRATHECAL

## 2017-01-05 MED ORDER — SODIUM CHLORIDE 0.9 % IJ SOLN
INTRAMUSCULAR | Status: AC
  Filled 2017-01-05: qty 20

## 2017-01-05 MED ORDER — CEFAZOLIN SODIUM-DEXTROSE 1-4 GM/50ML-% IV SOLN
1.0000 g | Freq: Four times a day (QID) | INTRAVENOUS | Status: AC
  Administered 2017-01-05 (×2): 1 g via INTRAVENOUS
  Filled 2017-01-05 (×2): qty 50

## 2017-01-05 MED ORDER — POLYETHYLENE GLYCOL 3350 17 G PO PACK: 17.0000 g | PACK | Freq: Every day | ORAL | Status: DC | PRN

## 2017-01-05 MED ORDER — PHENYLEPHRINE 40 MCG/ML (10ML) SYRINGE FOR IV PUSH (FOR BLOOD PRESSURE SUPPORT)
PREFILLED_SYRINGE | INTRAVENOUS | Status: AC
  Filled 2017-01-05: qty 10

## 2017-01-05 MED ORDER — PROPOFOL 10 MG/ML IV BOLUS
INTRAVENOUS | Status: AC
  Filled 2017-01-05: qty 40

## 2017-01-05 MED ORDER — ONDANSETRON HCL 4 MG/2ML IJ SOLN
INTRAMUSCULAR | Status: AC
  Filled 2017-01-05: qty 2

## 2017-01-05 MED ORDER — OXYCODONE-ACETAMINOPHEN 5-325 MG PO TABS
2.0000 | ORAL_TABLET | ORAL | Status: DC | PRN
  Administered 2017-01-05 – 2017-01-06 (×3): 1 via ORAL
  Filled 2017-01-05 (×3): qty 2

## 2017-01-05 MED ORDER — LACTATED RINGERS IV SOLN
INTRAVENOUS | Status: DC
  Administered 2017-01-05: 18:00:00 via INTRAVENOUS

## 2017-01-05 MED ORDER — SODIUM CHLORIDE 0.9 % IJ SOLN
INTRAMUSCULAR | Status: DC | PRN
  Administered 2017-01-05: 20 mL

## 2017-01-05 MED ORDER — ONDANSETRON HCL 4 MG PO TABS
4.0000 mg | ORAL_TABLET | Freq: Four times a day (QID) | ORAL | Status: DC | PRN
  Administered 2017-01-05: 4 mg via ORAL

## 2017-01-05 MED ORDER — FENTANYL CITRATE (PF) 100 MCG/2ML IJ SOLN
INTRAMUSCULAR | Status: DC | PRN
  Administered 2017-01-05 (×2): 50 ug via INTRAVENOUS

## 2017-01-05 MED ORDER — ONDANSETRON HCL 4 MG/2ML IJ SOLN
4.0000 mg | Freq: Four times a day (QID) | INTRAMUSCULAR | Status: DC | PRN
  Administered 2017-01-06: 4 mg via INTRAVENOUS
  Filled 2017-01-05 (×2): qty 2

## 2017-01-05 MED ORDER — ALUM & MAG HYDROXIDE-SIMETH 200-200-20 MG/5ML PO SUSP: 30.0000 mL | ORAL | Status: DC | PRN

## 2017-01-05 MED ORDER — PHENYLEPHRINE HCL 10 MG/ML IJ SOLN
INTRAMUSCULAR | Status: DC | PRN
  Administered 2017-01-05: 80 ug via INTRAVENOUS
  Administered 2017-01-05 (×3): 40 ug via INTRAVENOUS
  Administered 2017-01-05: 80 ug via INTRAVENOUS
  Administered 2017-01-05 (×3): 40 ug via INTRAVENOUS

## 2017-01-05 MED ORDER — MIDAZOLAM HCL 2 MG/2ML IJ SOLN
INTRAMUSCULAR | Status: AC
  Filled 2017-01-05: qty 2

## 2017-01-05 SURGICAL SUPPLY — 61 items
BAG DECANTER FOR FLEXI CONT (MISCELLANEOUS) ×2 IMPLANT
BAG ZIPLOCK 12X15 (MISCELLANEOUS) IMPLANT
BANDAGE ACE 4X5 VEL STRL LF (GAUZE/BANDAGES/DRESSINGS) ×2 IMPLANT
BANDAGE ACE 6X5 VEL STRL LF (GAUZE/BANDAGES/DRESSINGS) ×2 IMPLANT
BLADE SAG 18X100X1.27 (BLADE) ×2 IMPLANT
BLADE SAW SGTL 11.0X1.19X90.0M (BLADE) ×2 IMPLANT
BNDG GAUZE ELAST 4 BULKY (GAUZE/BANDAGES/DRESSINGS) ×2 IMPLANT
BONE CEMENT GENTAMICIN (Cement) ×4 IMPLANT
CAP KNEE TOTAL 3 SIGMA ×2 IMPLANT
CEMENT BONE GENTAMICIN 40 (Cement) ×2 IMPLANT
COVER SURGICAL LIGHT HANDLE (MISCELLANEOUS) ×2 IMPLANT
CUFF TOURN SGL QUICK 34 (TOURNIQUET CUFF) ×1
CUFF TRNQT CYL 34X4X40X1 (TOURNIQUET CUFF) ×1 IMPLANT
DECANTER SPIKE VIAL GLASS SM (MISCELLANEOUS) ×2 IMPLANT
DRAPE INCISE IOBAN 66X45 STRL (DRAPES) ×2 IMPLANT
DRAPE U-SHAPE 47X51 STRL (DRAPES) ×2 IMPLANT
DRSG ADAPTIC 3X8 NADH LF (GAUZE/BANDAGES/DRESSINGS) ×2 IMPLANT
DRSG PAD ABDOMINAL 8X10 ST (GAUZE/BANDAGES/DRESSINGS) ×4 IMPLANT
DURAPREP 26ML APPLICATOR (WOUND CARE) ×2 IMPLANT
ELECT REM PT RETURN 15FT ADLT (MISCELLANEOUS) ×2 IMPLANT
EVACUATOR 1/8 PVC DRAIN (DRAIN) ×2 IMPLANT
FACESHIELD WRAPAROUND (MASK) ×2 IMPLANT
GAUZE SPONGE 4X4 12PLY STRL (GAUZE/BANDAGES/DRESSINGS) ×2 IMPLANT
GLOVE BIOGEL PI IND STRL 6.5 (GLOVE) ×1 IMPLANT
GLOVE BIOGEL PI IND STRL 8.5 (GLOVE) ×1 IMPLANT
GLOVE BIOGEL PI INDICATOR 6.5 (GLOVE) ×1
GLOVE BIOGEL PI INDICATOR 8.5 (GLOVE) ×1
GLOVE ECLIPSE 8.0 STRL XLNG CF (GLOVE) ×4 IMPLANT
GLOVE SURG SS PI 6.5 STRL IVOR (GLOVE) ×2 IMPLANT
GOWN STRL REUS W/TWL LRG LVL3 (GOWN DISPOSABLE) ×2 IMPLANT
GOWN STRL REUS W/TWL XL LVL3 (GOWN DISPOSABLE) ×2 IMPLANT
HANDPIECE INTERPULSE COAX TIP (DISPOSABLE) ×1
HEMOSTAT SPONGE AVITENE ULTRA (HEMOSTASIS) ×2 IMPLANT
IMMOBILIZER KNEE 20 (SOFTGOODS) ×4 IMPLANT
IMMOBILIZER KNEE 20 THIGH 36 (SOFTGOODS) ×1 IMPLANT
MANIFOLD NEPTUNE II (INSTRUMENTS) ×2 IMPLANT
NEEDLE HYPO 21X1.5 SAFETY (NEEDLE) ×2 IMPLANT
NEEDLE HYPO 22GX1.5 SAFETY (NEEDLE) ×2 IMPLANT
NS IRRIG 1000ML POUR BTL (IV SOLUTION) IMPLANT
PACK TOTAL KNEE CUSTOM (KITS) ×2 IMPLANT
PAD ABD 8X10 STRL (GAUZE/BANDAGES/DRESSINGS) ×2 IMPLANT
PADDING CAST COTTON 6X4 STRL (CAST SUPPLIES) ×2 IMPLANT
PENCIL SMOKE EVAC W/HOLSTER (ELECTROSURGICAL) ×2 IMPLANT
POSITIONER SURGICAL ARM (MISCELLANEOUS) ×2 IMPLANT
SET HNDPC FAN SPRY TIP SCT (DISPOSABLE) ×1 IMPLANT
SET PAD KNEE POSITIONER (MISCELLANEOUS) ×2 IMPLANT
SPONGE LAP 18X18 X RAY DECT (DISPOSABLE) IMPLANT
STRIP CLOSURE SKIN 1/2X4 (GAUZE/BANDAGES/DRESSINGS) ×2 IMPLANT
SUT BONE WAX W31G (SUTURE) IMPLANT
SUT MNCRL AB 4-0 PS2 18 (SUTURE) ×2 IMPLANT
SUT VIC AB 1 CT1 27 (SUTURE) ×2
SUT VIC AB 1 CT1 27XBRD ANTBC (SUTURE) ×2 IMPLANT
SUT VIC AB 2-0 CT1 27 (SUTURE) ×3
SUT VIC AB 2-0 CT1 TAPERPNT 27 (SUTURE) ×3 IMPLANT
SUT VLOC 180 0 24IN GS25 (SUTURE) ×2 IMPLANT
SYR 20CC LL (SYRINGE) ×4 IMPLANT
TOWER CARTRIDGE SMART MIX (DISPOSABLE) ×2 IMPLANT
TRAY FOLEY W/METER SILVER 16FR (SET/KITS/TRAYS/PACK) ×2 IMPLANT
WATER STERILE IRR 1500ML POUR (IV SOLUTION) ×2 IMPLANT
WRAP KNEE MAXI GEL POST OP (GAUZE/BANDAGES/DRESSINGS) ×2 IMPLANT
YANKAUER SUCT BULB TIP 10FT TU (MISCELLANEOUS) ×2 IMPLANT

## 2017-01-05 NOTE — Evaluation (Signed)
Physical Therapy Evaluation Patient Details Name: Shelby ExonMarsha S Scheid MRN: [id number] DOB: 31-Dec-1950 Today's Date: 01/05/2017   History of Present Illness  66 y.o. female admitted on 01/05/17 for elective L TKA.  Pt with significant PMH of HTN and DM.    Clinical Impression  Pt is POD #0 and is moving well, limited this evening by pain.  She was able to get up with min to min guard assist and RW to the chair.  She will likely progress well enough to d/c home with her husband's assist.   PT to follow acutely for deficits listed below.       Follow Up Recommendations DC plan and follow up therapy as arranged by surgeon;Supervision for mobility/OOB    Equipment Recommendations  Rolling walker with 5" wheels    Recommendations for Other Services   NA    Precautions / Restrictions Precautions Precautions: Knee Precaution Booklet Issued: Yes (comment) Precaution Comments: knee exercise handout given and reviewed, WB status and no pillow under surgical knee reviewed.  Required Braces or Orthoses: Knee Immobilizer - Left Knee Immobilizer - Left: Other (comment) (no orders, but used POD #0) Restrictions Weight Bearing Restrictions: Yes LLE Weight Bearing: Weight bearing as tolerated      Mobility  Bed Mobility Overal bed mobility: Needs Assistance Bed Mobility: Supine to Sit     Supine to sit: Min assist;HOB elevated     General bed mobility comments: Min assist to help progress left leg to EOB.  Verbal cues for safe hand placement and sequencing.  HOB elevated ~30 degrees.   Transfers Overall transfer level: Needs assistance Equipment used: Rolling walker (2 wheeled) Transfers: Sit to/from [employer]Stand;Stand Pivot Transfers Sit to Stand: Min assist Stand pivot transfers: Min assist       General transfer comment: Min assist to support trunk during transition. Verbal cues for safe hand placement, however, ultimately, pt needed/wanted to put both hands on RW to push up, so PT had to  stabilize RW so it would not move.   Ambulation/Gait Ambulation/Gait assistance: Min guard Ambulation Distance (Feet): 3 Feet Assistive device: Rolling walker (2 wheeled) Gait Pattern/deviations: Step-to pattern;Antalgic     General Gait Details: Verbal cues for safe LE sequencing, min guard assist for safey and stability during gait.  Pivotal steps only to recliner chair.          Balance Overall balance assessment: Needs assistance Sitting-balance support: Feet supported;No upper extremity supported Sitting balance-Leahy Scale: Good     Standing balance support: Bilateral upper extremity supported Standing balance-Leahy Scale: Poor                               Pertinent Vitals/Pain Pain Assessment: 0-10 Pain Score: 5  Pain Location: left knee Pain Descriptors / Indicators: Grimacing;Guarding Pain Intervention(s): Monitored during session;Limited activity within patient's tolerance;Repositioned    Home Living Family/patient expects to be discharged to:: Private residence Living Arrangements: Spouse/significant other Available Help at Discharge: Family;Available 24 hours/day Type of Home: House Home Access: Stairs to enter Entrance Stairs-Rails: Doctor, general practiceight;Left Entrance Stairs-Number of Steps: 3 Home Layout: One level Home Equipment: None      Prior Function Level of Independence: Independent         Comments: retired        Extremity/Trunk Assessment   Upper Extremity Assessment Upper Extremity Assessment: Defer to OT evaluation    Lower Extremity Assessment Lower Extremity Assessment: LLE deficits/detail LLE Deficits / Details:  left leg with normal post op pain and weakness, ankle at least 3/5, knee 2-/5, hip flexion 2/5 LLE Sensation:  (intact to LT)    Cervical / Trunk Assessment Cervical / Trunk Assessment: Normal  Communication   Communication: No difficulties  Cognition Arousal/Alertness: Awake/alert Behavior During Therapy: WFL  for tasks assessed/performed Overall Cognitive Status: Within Functional Limits for tasks assessed                                           Exercises Total Joint Exercises Ankle Circles/Pumps: AROM;Both;20 reps   Assessment/Plan    PT Assessment Patient needs continued PT services  PT Problem List Decreased strength;Decreased range of motion;Decreased activity tolerance;Decreased mobility;Decreased balance;Decreased knowledge of use of DME;Decreased knowledge of precautions;Pain       PT Treatment Interventions DME instruction;Stair training;Gait training;Functional mobility training;Therapeutic activities;Therapeutic exercise;Balance training;Patient/family education;Modalities;Manual techniques    PT Goals (Current goals can be found in the Care Plan section)  Acute Rehab PT Goals Patient Stated Goal: to d/c home  PT Goal Formulation: With patient/family Time For Goal Achievement: 01/19/17 Potential to Achieve Goals: Good    Frequency 7X/week           AM-PAC PT "6 Clicks" Daily Activity  Outcome Measure Difficulty turning over in bed (including adjusting bedclothes, sheets and blankets)?: Total Difficulty moving from lying on back to sitting on the side of the bed? : Total Difficulty sitting down on and standing up from a chair with arms (e.g., wheelchair, bedside commode, etc,.)?: Total Help needed moving to and from a bed to chair (including a wheelchair)?: A Little Help needed walking in hospital room?: A Little Help needed climbing 3-5 steps with a railing? : A Lot 6 Click Score: 11    End of Session Equipment Utilized During Treatment: Gait belt;Left knee immobilizer Activity Tolerance: Patient limited by fatigue;Patient limited by pain Patient left: in chair;with call bell/phone within reach;with chair alarm set;with family/visitor present Nurse Communication: Mobility status PT Visit Diagnosis: Muscle weakness (generalized) (M62.81);Difficulty  in walking, not elsewhere classified (R26.2);Pain Pain - Right/Left: Left Pain - part of body: Knee    Time: 1610-9604 PT Time Calculation (min) (ACUTE ONLY): 27 min   Charges:      Lurena Joiner B. Rakeen Gaillard, PT, DPT [phone]    PT Evaluation $PT Eval Moderate Complexity: 1 Procedure PT Treatments $Therapeutic Activity: 8-22 mins   01/05/2017, 10:03 PM

## 2017-01-05 NOTE — Brief Op Note (Signed)
01/05/2017  12:58 PM  PATIENT:  Shelby ExonMarsha S Duke  66 y.o. 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  SURGEON:  Surgeon(s) and Role:    * Ranee GosselinGioffre, Righteous Claiborne, MD - Primary  PHYSICIAN ASSISTANT: Dimitri PedAmber Constable PA  ASSISTANTS: Dimitri PedAmber Constable PA   ANESTHESIA:   spinal and Adductor Canal Block  EBL:  Total I/O In: 1000 [I.V.:1000] Out: 100 [Urine:50; Blood:50]  BLOOD ADMINISTERED:none  DRAINS: (One) Hemovact drain(s) in the Left Knee with  Suction Open   LOCAL MEDICATIONS USED:  OTHER 20cc of Exparel mixed with 20cc of Normal Saline.  SPECIMEN:  No Specimen  DISPOSITION OF SPECIMEN:  N/A  COUNTS:  YES  TOURNIQUET:  * Missing tourniquet times found for documented tourniquets in log:  [id number] *  DICTATION: .Other Dictation: Dictation Number [id number]  PLAN OF CARE: Admit to inpatient   PATIENT DISPOSITION:  Stable in OR   Delay start of Pharmacological VTE agent (>24hrs) due to surgical blood loss or risk of bleeding: yes

## 2017-01-05 NOTE — Progress Notes (Signed)
Assisted Dr. Miller with left, ultrasound guided, adductor canal block. Side rails up, monitors on throughout procedure. See vital signs in flow sheet. Tolerated Procedure well.  

## 2017-01-05 NOTE — Interval H&P Note (Signed)
History and Physical Interval Note:  01/05/2017 10:56 AM  Shelby ExonMarsha S Thrush  has presented today for surgery, with the diagnosis of Left knee osteoarthritis   The various methods of treatment have been discussed with the patient and family. After consideration of risks, benefits and other options for treatment, the patient has consented to  Procedure(s): LEFT TOTAL KNEE ARTHROPLASTY (Left) as a surgical intervention .  The patient's history has been reviewed, patient examined, no change in status, stable for surgery.  I have reviewed the patient's chart and labs.  Questions were answered to the patient's satisfaction.     [name]

## 2017-01-05 NOTE — Progress Notes (Addendum)
Patient was given 4mg  Zofran IV at 1818. Patient still nauseous. [facility] Nurse is awaiting return call or new orders to be placed.   Porfirio Mylararmen Mayo returned call to nurse, gave verbal order for one time dose of reglan 10mg  IV for nausea. Nurse entered verbal order.

## 2017-01-05 NOTE — Plan of Care (Signed)
Problem: Bowel/Gastric: Goal: Will not experience complications related to bowel motility Outcome: Not Progressing Patient having nausea and vomiting

## 2017-01-05 NOTE — Anesthesia Procedure Notes (Signed)
Spinal  Patient location during procedure: OR Start time: 01/05/2017 11:08 AM End time: 01/05/2017 11:13 AM Staffing Anesthesiologist: Anitra LauthMILLER, WARREN RAY Performed: anesthesiologist  Preanesthetic Checklist Completed: patient identified, site marked, surgical consent, pre-op evaluation, timeout performed, IV checked, risks and benefits discussed and monitors and equipment checked Spinal Block Patient position: sitting Prep: DuraPrep Patient monitoring: heart rate, cardiac monitor, continuous pulse ox and blood pressure Approach: midline Location: L3-4 Injection technique: single-shot Needle Needle type: Quincke  Needle gauge: 22 G Needle length: 15.2 cm

## 2017-01-05 NOTE — Anesthesia Procedure Notes (Signed)
Anesthesia Regional Block: Adductor canal block   Pre-Anesthetic Checklist: ,, timeout performed, Correct Patient, Correct Site, Correct Laterality, Correct Procedure, Correct Position, site marked, Risks and benefits discussed,  Surgical consent,  Pre-op evaluation,  At surgeon's request and post-op pain management  Laterality: Left  Prep: Dura Prep       Needles:  Injection technique: Single-shot  Needle Type: Stimiplex     Needle Length: 9cm  Needle Gauge: 21     Additional Needles:   Procedures: ultrasound guided,,,,,,,,  Narrative:  Start time: 01/05/2017 10:41 AM End time: 01/05/2017 10:46 AM Injection made incrementally with aspirations every 5 mL.  Performed by: Personally  Anesthesiologist: Anitra LauthMILLER, Philopateer Strine RAY

## 2017-01-05 NOTE — Anesthesia Preprocedure Evaluation (Signed)
Anesthesia Evaluation  Patient identified by MRN, date of birth, ID band Patient awake    Reviewed: Allergy & Precautions, NPO status , Patient's Chart, lab work & pertinent test results  Airway Mallampati: II  TM Distance: >3 FB Neck ROM: Full    Dental no notable dental hx.    Pulmonary neg pulmonary ROS,    Pulmonary exam normal breath sounds clear to auscultation       Cardiovascular hypertension, Pt. on medications negative cardio ROS Normal cardiovascular exam Rhythm:Regular Rate:Normal     Neuro/Psych negative neurological ROS  negative psych ROS   GI/Hepatic negative GI ROS, Neg liver ROS,   Endo/Other  negative endocrine ROSdiabetes, Type 2, Oral Hypoglycemic AgentsMorbid obesity  Renal/GU negative Renal ROS  negative genitourinary   Musculoskeletal negative musculoskeletal ROS (+) Arthritis ,   Abdominal (+) + obese,   Peds negative pediatric ROS (+)  Hematology negative hematology ROS (+)   Anesthesia Other Findings   Reproductive/Obstetrics negative OB ROS                             Anesthesia Physical Anesthesia Plan  ASA: III  Anesthesia Plan: MAC and Spinal   Post-op Pain Management:  Regional for Post-op pain   Induction: Intravenous  PONV Risk Score and Plan: 2 and Ondansetron and Dexamethasone  Airway Management Planned: Simple Face Mask  Additional Equipment:   Intra-op Plan:   Post-operative Plan:   Informed Consent: I have reviewed the patients History and Physical, chart, labs and discussed the procedure including the risks, benefits and alternatives for the proposed anesthesia with the patient or authorized representative who has indicated his/her understanding and acceptance.   Dental advisory given  Plan Discussed with: CRNA  Anesthesia Plan Comments:         Anesthesia Quick Evaluation

## 2017-01-05 NOTE — Anesthesia Postprocedure Evaluation (Signed)
Anesthesia Post Note  Patient: Shelby Duke  Procedure(s) Performed: Procedure(s) (LRB): LEFT TOTAL KNEE ARTHROPLASTY (Left)     Patient location during evaluation: PACU Anesthesia Type: MAC and Spinal Level of consciousness: oriented and awake and alert Pain management: pain level controlled Vital Signs Assessment: post-procedure vital signs reviewed and stable Respiratory status: spontaneous breathing and respiratory function stable Cardiovascular status: blood pressure returned to baseline and stable Postop Assessment: no headache and no backache Anesthetic complications: no    Last Vitals:  Vitals:   01/05/17 1430 01/05/17 1445  BP: 127/72 117/74  Pulse: 72 75  Resp: 16 13  Temp:      Last Pain:  Vitals:   01/05/17 1400  TempSrc:   PainSc: 0-No pain                 Lynda Rainwater

## 2017-01-05 NOTE — Transfer of Care (Signed)
Immediate Anesthesia Transfer of Care Note  Patient: Shelby Duke  Procedure(s) Performed: Procedure(s): LEFT TOTAL KNEE ARTHROPLASTY (Left)  Patient Location: PACU  Anesthesia Type:MAC, Regional and Spinal  Level of Consciousness: Patient easily awoken, sedated, comfortable, cooperative, following commands, responds to stimulation.   Airway & Oxygen Therapy: Patient spontaneously breathing, ventilating well, oxygen via simple oxygen mask.  Post-op Assessment: Report given to PACU RN, vital signs reviewed and stable.   Post vital signs: Reviewed and stable.  Complications: No apparent anesthesia complications  Last Vitals:  Vitals:   01/05/17 1059 01/05/17 1100  BP:    Pulse: 81 77  Resp:    Temp:      Last Pain:  Vitals:   01/05/17 1057  TempSrc:   PainSc: 4       Patients Stated Pain Goal: 3 (30/16/01 0932)  Complications: No apparent anesthesia complications

## 2017-01-05 NOTE — Op Note (Signed)
NAMLucky Cowboy:  Blasing, MARHSA                 ACCOUNT NO.:  [id number]  MEDICAL RECORD NO.:  [id number]  LOCATION:                                 FACILITY:  PHYSICIAN:  Georges Lynchonald A. Lilianah Buffin, M.D.DATE OF BIRTH:  [birth year]  DATE OF PROCEDURE:  01/05/2017 DATE OF DISCHARGE:                              OPERATIVE REPORT   SURGEON:  Georges Lynchonald A. Darrelyn HillockGioffre, M.D.  ASSISTANT:  Dimitri PedAmber Constable, Georgia[place].  PREOPERATIVE DIAGNOSES: 1. Severe primary osteoarthritis with bone-on-bone, left knee. 2. Severe flexion contracture, left knee. 3. Severe genu varus, left knee.  POSTOPERATIVE DIAGNOSES: 1. Severe primary osteoarthritis with bone-on-bone, left knee. 2. Severe flexion contracture, left knee. 3. Severe genu varus, left knee.  OPERATION:  Left total knee arthroplasty utilizing DePuy system, the sizes used and all 3 components were cemented and gentamicin was used in the cement.  The sizes used was a size 4 left femoral component posterior cruciate sacrificing type, the tray was a size 4.  The insert was a size 4, 12.5 mm thickness rotating platform type.  The patella was a size 35 with 3 pegs.  PROCEDURE:  Under spinal anesthesia, routine orthopedic prep and draping of the left lower extremity was carried out.  The appropriate time-out was first carried out, also marked the appropriate left leg in the holding area.  A 2 g of IV Ancef was given.  She was given 1 g of TXA as well.  At this time, the leg was exsanguinated and Esmarch tourniquet was elevated to 325 mmHg.  The knee was placed in a [facility] knee holder and flexed.  Anterior approach to the knee was carried out, bleeders identified and cauterized.  I then carried out a median parapatellar incision.  The patella was reflected laterally and I excised the anterior posterior cruciate ligaments and did medial and lateral meniscectomies.  At this time, I did a nice medial release from the tibia as well, so because of the severe genu varus and  contracture. Following that, I then removed 12 mm thickness off the distal femur because of the extreme tightness of the joint.  I then measured the femur to be a size 4 left.  We then utilized our next jig, did anterior- posterior chamfering cuts for a size 4 left femoral component.  At this time, attention was directed to the tibia.  She had multiple large osteophytes that were removed.  We removed the spinous process with the oscillating saw.  I then measured the tray to be a size 4.  We then made our initial drill hole in the proximal tibia.  We then inserted our canal finder.  I then removed that and thoroughly irrigated out the tibia as we did the femoral side.  At this time, we then removed 4 mm thickness off the affected medial side of the tibia.  We used that medial side as our guide.  Following that, we then inserted our lamina spreaders, removed the posterior spurs and large osteophytes.  There was an extremely large, loose body highlight that we removed initially from the joint.  At this particular time, we then went on and completed our keel cut of the  tibia.  Note, the tibia was quite deformed.  We evened that out quite nicely and put 3 drill holes in the medial side of the tibial plateau.  We then inserted our trial components, reduced the knee and had an excellent fit with a 12.5 mm thickness insert.  We went through our spacer blocks first.  After this, we did a resurfacing procedure on the patella for a size 35 patella.  Three drill holes were made in the patella in the usual fashion.  We then went through range of motion of the knee, had a nice fit.  We removed all trial components, water picked out the knee.  I then cemented all 3 components in simultaneously.  When the cement was dried, we then removed the loose pieces of cement.  Gentamicin was used in the cement.  After the components were in, thoroughly water picked the knee out again, inserted a Hemovac drain and  closed the knee in layers over a Hemovac drain in the usual fashion.  Sterile dressings were applied.  She will be on Xarelto postop.  She is allergic to aspirin.          ______________________________ Georges Lynch. Darrelyn Hillock, M.D.     RAG/MEDQ  D:  01/05/2017  T:  01/05/2017  Job:  [id number]

## 2017-01-06 ENCOUNTER — Encounter (HOSPITAL_COMMUNITY): Payer: Self-pay | Admitting: Orthopedic Surgery

## 2017-01-06 LAB — CBC
HCT: 35.7 % — ABNORMAL LOW (ref 36.0–46.0)
HEMOGLOBIN: 11.5 g/dL — AB (ref 12.0–15.0)
MCH: 27.1 pg (ref 26.0–34.0)
MCHC: 32.2 g/dL (ref 30.0–36.0)
MCV: 84.2 fL (ref 78.0–100.0)
Platelets: 269 10*3/uL (ref 150–400)
RBC: 4.24 MIL/uL (ref 3.87–5.11)
RDW: 14.6 % (ref 11.5–15.5)
WBC: 14.2 10*3/uL — ABNORMAL HIGH (ref 4.0–10.5)

## 2017-01-06 LAB — BASIC METABOLIC PANEL
Anion gap: 10 (ref 5–15)
BUN: 12 mg/dL (ref 6–20)
CALCIUM: 8.9 mg/dL (ref 8.9–10.3)
CHLORIDE: 103 mmol/L (ref 101–111)
CO2: 26 mmol/L (ref 22–32)
CREATININE: 0.78 mg/dL (ref 0.44–1.00)
GFR calc non Af Amer: >60 mL/min (ref >60)
Glucose, Bld: 145 mg/dL — ABNORMAL HIGH (ref 65–99)
Potassium: 4.1 mmol/L (ref 3.5–5.1)
SODIUM: 139 mmol/L (ref 135–145)

## 2017-01-06 LAB — GLUCOSE, CAPILLARY
GLUCOSE-CAPILLARY: 158 mg/dL — AB (ref 65–99)
Glucose-Capillary: 130 mg/dL — ABNORMAL HIGH (ref 65–99)
Glucose-Capillary: 149 mg/dL — ABNORMAL HIGH (ref 65–99)
Glucose-Capillary: 171 mg/dL — ABNORMAL HIGH (ref 65–99)

## 2017-01-06 MED ORDER — METHOCARBAMOL 500 MG PO TABS: 500.0000 mg | ORAL_TABLET | Freq: Four times a day (QID) | ORAL | 1 refills | Status: DC | PRN

## 2017-01-06 MED ORDER — OXYCODONE-ACETAMINOPHEN 5-325 MG PO TABS: 1.0000 | ORAL_TABLET | ORAL | Status: DC | PRN

## 2017-01-06 MED ORDER — RIVAROXABAN 10 MG PO TABS: 10.0000 mg | ORAL_TABLET | Freq: Every day | ORAL | 0 refills | Status: DC

## 2017-01-06 MED ORDER — OXYCODONE-ACETAMINOPHEN 5-325 MG PO TABS: 1.0000 | ORAL_TABLET | ORAL | 0 refills | Status: DC | PRN

## 2017-01-06 NOTE — Progress Notes (Signed)
Subjective: 1 Day Post-Op Procedure(s) (LRB): LEFT TOTAL KNEE ARTHROPLASTY (Left) Patient reports pain as 2 on 0-10 scale.  Dressing changed and hemovac DCd and wound looks fine.   Objective: Vital signs in last 24 hours: Temp:  [97.4 F (36.3 C)-98.9 F (37.2 C)] 98.9 F (37.2 C) (06/14 0457) Pulse Rate:  [61-95] 95 (06/14 0457) Resp:  [13-21] 17 (06/14 0457) BP: (113-145)/(56-99) 145/77 (06/14 0457) SpO2:  [95 %-100 %] 96 % (06/14 0457) Weight:  [103.9 kg (229 lb)] 103.9 kg (229 lb) (06/13 0916)  Intake/Output from previous day: 06/13 0701 - 06/14 0700 In: 2400 [P.O.:600; I.V.:1800] Out: 1430 [Urine:1375; Drains:5; Blood:50] Intake/Output this shift: No intake/output data recorded.   Recent Labs  01/06/17 0441  HGB 11.5*    Recent Labs  01/06/17 0441  WBC 14.2*  RBC 4.24  HCT 35.7*  PLT 269    Recent Labs  01/06/17 0441  NA 139  K 4.1  CL 103  CO2 26  BUN 12  CREATININE 0.78  GLUCOSE 145*  CALCIUM 8.9   No results for input(s): LABPT, INR in the last 72 hours.  Neurologically intact No cellulitis present Compartment soft  Assessment/Plan: 1 Day Post-Op Procedure(s) (LRB): LEFT TOTAL KNEE ARTHROPLASTY (Left) Up with therapy  Boomer Winders A 01/06/2017, 7:25 AM

## 2017-01-06 NOTE — Progress Notes (Signed)
Physical Therapy Treatment Patient Details Name: Shelby Duke MRN: [id number] DOB: [birth year] Today's Date: 01/06/2017    History of Present Illness 66 y.o. female admitted on 01/05/17 for elective L TKA.  Pt with significant PMH of HTN and DM.      PT Comments    POD # 1 am session Applied KI and instructed on KI use for amb and esp stairs.  Assisted with ambulation a limited distance due to c/o nausea and fatigue.    Follow Up Recommendations  DC plan and follow up therapy as arranged by surgeon;Supervision for mobility/OOB     Equipment Recommendations  Rolling walker with 5" wheels;3in1 (PT)    Recommendations for Other Services       Precautions / Restrictions Precautions Precautions: Knee Precaution Comments: instructed on KI use for amb Required Braces or Orthoses: Knee Immobilizer - Left Restrictions Weight Bearing Restrictions: No LLE Weight Bearing: Weight bearing as tolerated    Mobility  Bed Mobility Overal bed mobility: Needs Assistance Bed Mobility: Supine to Sit     Supine to sit: Min assist;Mod assist     General bed mobility comments: assist L LE and increased time to scoot to EOB  Transfers Overall transfer level: Needs assistance Equipment used: Rolling walker (2 wheeled) Transfers: Sit to/from [employer] Sit to Stand: Min assist Stand pivot transfers: Min assist       General transfer comment: assist to rise and steady. Cues for UE/LE placement      increased time  Ambulation/Gait Ambulation/Gait assistance: Min guard Ambulation Distance (Feet): 18 Feet Assistive device: Rolling walker (2 wheeled) Gait Pattern/deviations: Step-to pattern;Antalgic;Decreased stance time - left Gait velocity: decreased   General Gait Details: decreased distance due to c/o nausea.  Recliner following for safety   Stairs            Wheelchair Mobility    Modified Rankin (Stroke Patients Only)       Balance                                             Cognition Arousal/Alertness: Awake/alert Behavior During Therapy: WFL for tasks assessed/performed Overall Cognitive Status: Within Functional Limits for tasks assessed                                        Exercises      General Comments        Pertinent Vitals/Pain Pain Assessment: 0-10 Pain Score: 4  Pain Location: L knee Pain Descriptors / Indicators: Operative site guarding;Tender;Sore Pain Intervention(s): Monitored during session;Repositioned;Ice applied    Home Living Family/patient expects to be discharged to:: Private residence Living Arrangements: Spouse/significant other Available Help at Discharge: Family;Available 24 hours/day         Home Equipment: Shower seat - built in      Prior Function Level of Independence: Independent      Comments: retired   PT Goals (current goals can now be found in the care plan section) Acute Rehab PT Goals Patient Stated Goal: to d/c home  Progress towards PT goals: Progressing toward goals    Frequency    7X/week      PT Plan Current plan remains appropriate    Co-evaluation  AM-PAC PT "6 Clicks" Daily Activity  Outcome Measure  Difficulty turning over in bed (including adjusting bedclothes, sheets and blankets)?: Total Difficulty moving from lying on back to sitting on the side of the bed? : Total Difficulty sitting down on and standing up from a chair with arms (e.g., wheelchair, bedside commode, etc,.)?: Total Help needed moving to and from a bed to chair (including a wheelchair)?: A Little Help needed walking in hospital room?: A Little Help needed climbing 3-5 steps with a railing? : A Lot 6 Click Score: 11    End of Session Equipment Utilized During Treatment: Gait belt;Left knee immobilizer Activity Tolerance: Other (comment) (nausea) Patient left: in chair;with call bell/phone within reach;with chair alarm set;with  family/visitor present Nurse Communication:  (pt nausea) PT Visit Diagnosis: Muscle weakness (generalized) (M62.81);Difficulty in walking, not elsewhere classified (R26.2);Pain Pain - Right/Left: Left     Time: [id number] PT Time Calculation (min) (ACUTE ONLY): 25 min  Charges:  $Gait Training: 8-22 mins $Therapeutic Activity: 8-22 mins                    G Codes:       Felecia ShellingLori Azure Barrales  PTA [facility]  Acute  Rehab Pager      [phone]

## 2017-01-06 NOTE — Progress Notes (Signed)
Discharge plan:  Pt with HHPT order and when offered choice pt chooses [facility]. [facility] rep aware of referral. 3in1 and RW ordered for pt and [facility] rep alerted of orders.  Sandford Crazeora Wilkes Potvin RN,BSN,NCM [phone]

## 2017-01-06 NOTE — Evaluation (Signed)
Occupational Therapy Evaluation Patient Details Name: Shelby Duke MRN: [id number] DOB: 1951-02-15 Today's Date: 01/06/2017    History of Present Illness 66 y.o. female admitted on 01/05/17 for elective L TKA.  Pt with significant PMH of HTN and DM.     Clinical Impression   Pt was admitted for the above sx. She was limited by pain, dizziness, and fatique but was able to perform SPT to 3:1 commode. She will benefit from continued OT to increase safety and independence with bathroom transfers.  Goals in acute are for min guard level    Follow Up Recommendations  Supervision/Assistance - 24 hour    Equipment Recommendations  3 in 1 bedside commode    Recommendations for Other Services       Precautions / Restrictions Precautions Precautions: Knee Required Braces or Orthoses: Knee Immobilizer - Left Restrictions LLE Weight Bearing: Weight bearing as tolerated      Mobility Bed Mobility               General bed mobility comments: oob by PT  Transfers   Equipment used: Rolling walker (2 wheeled)   Sit to Stand: Min assist Stand pivot transfers: Min assist       General transfer comment: assist to rise and steady. Cues for UE/LE placement    Balance                                           ADL either performed or assessed with clinical judgement   ADL Overall ADL's : Needs assistance/impaired     Grooming: Set up;Sitting   Upper Body Bathing: Set up;Sitting   Lower Body Bathing: Moderate assistance;Sit to/from stand   Upper Body Dressing : Set up;Sitting   Lower Body Dressing: Maximal assistance;Sit to/from stand   Toilet Transfer: Minimal assistance;Stand-pivot;BSC;RW   Toileting- Clothing Manipulation and Hygiene: Moderate assistance;Sit to/from stand         General ADL Comments: practiced toilet transfer: pt initially dizzy when sitting up, but resolved. Husband will assist with adls. She is unable to lift LLE. She does  have a long sponge she can use..  Reviewed general safety and use of KI as well as knee precautions     Vision         Perception     Praxis      Pertinent Vitals/Pain Pain Score: 6  Pain Location: L knee Pain Descriptors / Indicators: Aching Pain Intervention(s): Limited activity within patient's tolerance;Monitored during session;Premedicated before session;Repositioned;Ice applied     Hand Dominance     Extremity/Trunk Assessment Upper Extremity Assessment Upper Extremity Assessment: Overall WFL for tasks assessed           Communication Communication Communication: No difficulties   Cognition Arousal/Alertness: Awake/alert Behavior During Therapy: WFL for tasks assessed/performed Overall Cognitive Status: Within Functional Limits for tasks assessed                                     General Comments       Exercises     Shoulder Instructions      Home Living Family/patient expects to be discharged to:: Private residence Living Arrangements: Spouse/significant other Available Help at Discharge: Family;Available 24 hours/day               Bathroom  Shower/Tub: Producer, television/film/videoWalk-in shower   Bathroom Toilet: Standard     Home Equipment: Information systems managerhower seat - built in          Prior Functioning/Environment Level of Independence: Independent        Comments: retired        Pharmacist, communityT Problem List: Decreased strength;Decreased activity tolerance;Decreased knowledge of use of DME or AE;Pain      OT Treatment/Interventions: Self-care/ADL training;DME and/or AE instruction;Patient/family education    OT Goals(Current goals can be found in the care plan section) Acute Rehab OT Goals Patient Stated Goal: to d/c home  OT Goal Formulation: With patient Time For Goal Achievement: 01/13/17 Potential to Achieve Goals: Good ADL Goals Pt Will Perform Grooming: with supervision;standing Pt Will Transfer to Toilet: with min guard assist;ambulating;bedside  commode Pt Will Perform Tub/Shower Transfer: with min guard assist;Shower transfer;ambulating;shower seat (vs verbalize sequence)  OT Frequency: Min 2X/week   Barriers to D/C:            Co-evaluation              AM-PAC PT "6 Clicks" Daily Activity     Outcome Measure Help from another person eating meals?: None Help from another person taking care of personal grooming?: A Little Help from another person toileting, which includes using toliet, bedpan, or urinal?: A Lot Help from another person bathing (including washing, rinsing, drying)?: A Lot Help from another person to put on and taking off regular upper body clothing?: A Little Help from another person to put on and taking off regular lower body clothing?: A Lot 6 Click Score: 16   End of Session    Activity Tolerance: Patient limited by fatigue Patient left: in chair;with call bell/phone within reach;with chair alarm set  OT Visit Diagnosis: Pain Pain - Right/Left: Left Pain - part of body: Knee                Time: [id number] OT Time Calculation (min): 25 min Charges:  OT General Charges $OT Visit: 1 Procedure OT Evaluation $OT Eval Low Complexity: 1 Procedure OT Treatments $Therapeutic Activity: 8-22 mins G-Codes:     Shelby Duke, OTR/L [phone] 01/06/2017  Shelby Duke 01/06/2017, 11:36 AM

## 2017-01-06 NOTE — Addendum Note (Signed)
Addendum  created 01/06/17 [id number] by Elyn PeersAllen, Verbena Boeding J, CRNA   Charge Capture section accepted

## 2017-01-06 NOTE — Discharge Instructions (Addendum)
INSTRUCTIONS AFTER JOINT REPLACEMENT  ° °o Remove items at home which could result in a fall. This includes throw rugs or furniture in walking pathways °o ICE to the affected joint every three hours while awake for 30 minutes at a time, for at least the first 3-5 days, and then as needed for pain and swelling.  Continue to use ice for pain and swelling. You may notice swelling that will progress down to the foot and ankle.  This is normal after surgery.  Elevate your leg when you are not up walking on it.   °o Continue to use the breathing machine you got in the hospital (incentive spirometer) which will help keep your temperature down.  It is common for your temperature to cycle up and down following surgery, especially at night when you are not up moving around and exerting yourself.  The breathing machine keeps your lungs expanded and your temperature down. ° ° °DIET:  As you were doing prior to hospitalization, we recommend a well-balanced diet. ° °DRESSING / WOUND CARE / SHOWERING ° °You may change your dressing every day with sterile gauze.  Please use good hand washing techniques before changing the dressing.  Do not use any lotions or creams on the incision until instructed by your surgeon. ° °ACTIVITY ° °o Increase activity slowly as tolerated, but follow the weight bearing instructions below.   °o No driving for 6 weeks or until further direction given by your physician.  You cannot drive while taking narcotics.  °o No lifting or carrying greater than 10 lbs. until further directed by your surgeon. °o Avoid periods of inactivity such as sitting longer than an hour when not asleep. This helps prevent blood clots.  °o You may return to work once you are authorized by your doctor.  ° ° ° °WEIGHT BEARING  ° °Weight bearing as tolerated with assist device (walker, cane, etc) as directed, use it as long as suggested by your surgeon or therapist, typically at least 4-6 weeks. ° ° °EXERCISES ° °Results after joint  replacement surgery are often greatly improved when you follow the exercise, range of motion and muscle strengthening exercises prescribed by your doctor. Safety measures are also important to protect the joint from further injury. Any time any of these exercises cause you to have increased pain or swelling, decrease what you are doing until you are comfortable again and then slowly increase them. If you have problems or questions, call your caregiver or physical therapist for advice.  ° °Rehabilitation is important following a joint replacement. After just a few days of immobilization, the muscles of the leg can become weakened and shrink (atrophy).  These exercises are designed to build up the tone and strength of the thigh and leg muscles and to improve motion. Often times heat used for twenty to thirty minutes before working out will loosen up your tissues and help with improving the range of motion but do not use heat for the first two weeks following surgery (sometimes heat can increase post-operative swelling).  ° °These exercises can be done on a training (exercise) mat, on the floor, on a table or on a bed. Use whatever works the best and is most comfortable for you.    Use music or television while you are exercising so that the exercises are a pleasant break in your day. This will make your life better with the exercises acting as a break in your routine that you can look forward to.     Perform all exercises about fifteen times, three times per day or as directed.  You should exercise both the operative leg and the other leg as well. ° °Exercises include: °  °• Quad Sets - Tighten up the muscle on the front of the thigh (Quad) and hold for 5-10 seconds.   °• Straight Leg Raises - With your knee straight (if you were given a brace, keep it on), lift the leg to 60 degrees, hold for 3 seconds, and slowly lower the leg.  Perform this exercise against resistance later as your leg gets stronger.  °• Leg Slides:  Lying on your back, slowly slide your foot toward your buttocks, bending your knee up off the floor (only go as far as is comfortable). Then slowly slide your foot back down until your leg is flat on the floor again.  °• Angel Wings: Lying on your back spread your legs to the side as far apart as you can without causing discomfort.  °• Hamstring Strength:  Lying on your back, push your heel against the floor with your leg straight by tightening up the muscles of your buttocks.  Repeat, but this time bend your knee to a comfortable angle, and push your heel against the floor.  You may put a pillow under the heel to make it more comfortable if necessary.  ° °A rehabilitation program following joint replacement surgery can speed recovery and prevent re-injury in the future due to weakened muscles. Contact your doctor or a physical therapist for more information on knee rehabilitation.  ° ° °CONSTIPATION ° °Constipation is defined medically as fewer than three stools per week and severe constipation as less than one stool per week.  Even if you have a regular bowel pattern at home, your normal regimen is likely to be disrupted due to multiple reasons following surgery.  Combination of anesthesia, postoperative narcotics, change in appetite and fluid intake all can affect your bowels.  ° °YOU MUST use at least one of the following options; they are listed in order of increasing strength to get the job done.  They are all available over the counter, and you may need to use some, POSSIBLY even all of these options:   ° °Drink plenty of fluids (prune juice may be helpful) and high fiber foods °Colace 100 mg by mouth twice a day  °Senokot for constipation as directed and as needed Dulcolax (bisacodyl), take with full glass of water  °Miralax (polyethylene glycol) once or twice a day as needed. ° °If you have tried all these things and are unable to have a bowel movement in the first 3-4 days after surgery call either your  surgeon or your primary doctor.   ° °If you experience loose stools or diarrhea, hold the medications until you stool forms back up.  If your symptoms do not get better within 1 week or if they get worse, check with your doctor.  If you experience "the worst abdominal pain ever" or develop nausea or vomiting, please contact the office immediately for further recommendations for treatment. ° ° °ITCHING:  If you experience itching with your medications, try taking only a single pain pill, or even half a pain pill at a time.  You can also use Benadryl over the counter for itching or also to help with sleep.  ° °TED HOSE STOCKINGS:  Use stockings on both legs until for at least 2 weeks or as directed by physician office. They may be removed at night for   sleeping.  MEDICATIONS:  See your medication summary on the After Visit Summary that nursing will review with you.  You may have some home medications which will be placed on hold until you complete the course of blood thinner medication.  It is important for you to complete the blood thinner medication as prescribed.  PRECAUTIONS:  If you experience chest pain or shortness of breath - call 911 immediately for transfer to the hospital emergency department.   If you develop a fever greater that 101 F, purulent drainage from wound, increased redness or drainage from wound, foul odor from the wound/dressing, or calf pain - CONTACT YOUR SURGEON.                                                   FOLLOW-UP APPOINTMENTS:  If you do not already have a post-op appointment, please call the office for an appointment to be seen by your surgeon.  Guidelines for how soon to be seen are listed in your After Visit Summary, but are typically between 1-4 weeks after surgery.  OTHER INSTRUCTIONS:  Do not take vitamins or supplements while taking Xarelto    MAKE SURE YOU:   Understand these instructions.   Get help right away if you are not doing well or get worse.     Thank you for letting us be a part of your medical care team.  It is a privilege we respect greatly.  We hope these instructions will help you stay on track for a fast and full recovery!   Information on my medicine - XARELTO (Rivaroxaban)  This medication education was reviewed with me or my healthcare representative as part of my discharge preparation.  The pharmacist that spoke with me during my hospital stay was:  Berkley HarveyLegge, Dreshaun Stene Marshall, [facility]  Why was Xarelto prescribed for you? Xarelto was prescribed for you to reduce the risk of blood clots forming after orthopedic surgery. The medical term for these abnormal blood clots is venous thromboembolism (VTE).  What do you need to know about xarelto ? Take your Xarelto ONCE DAILY at the same time every day. You may take it either with or without food.  If you have difficulty swallowing the tablet whole, you may crush it and mix in applesauce just prior to taking your dose.  Take Xarelto exactly as prescribed by your doctor and DO NOT stop taking Xarelto without talking to the doctor who prescribed the medication.  Stopping without other VTE prevention medication to take the place of Xarelto may increase your risk of developing a clot.  After discharge, you should have regular check-up appointments with your healthcare provider that is prescribing your Xarelto.    What do you do if you miss a dose? If you miss a dose, take it as soon as you remember on the same day then continue your regularly scheduled once daily regimen the next day. Do not take two doses of Xarelto on the same day.   Important Safety Information A possible side effect of Xarelto is bleeding. You should call your healthcare provider right away if you experience any of the following: ? Bleeding from an injury or your nose that does not stop. ? Unusual colored urine (red or dark brown) or unusual colored stools (red or black). ? Unusual bruising for unknown  reasons. ? A serious  or if you hit your head (even if there is no bleeding). ° °Some medicines may interact with Xarelto® and might increase your risk of bleeding while on Xarelto®. To help avoid this, consult your healthcare provider or pharmacist prior to using any new prescription or non-prescription medications, including herbals, vitamins, non-steroidal anti-inflammatory drugs (NSAIDs) and supplements. ° °This website has more information on Xarelto®: www.xarelto.com. ° ° ° ° °

## 2017-01-06 NOTE — Progress Notes (Signed)
Physical Therapy Treatment Patient Details Name: Shelby Duke MRN: [id number] DOB: Dec 05, 1950 Today's Date: 01/06/2017    History of Present Illness 66 y.o. female admitted on 01/05/17 for elective L TKA.  Pt with significant PMH of HTN and DM.      PT Comments    Pt gait continues to be limited by nausea, but reports only 3/10 pain.  Reviewed HEP with pt and husband.   Follow Up Recommendations  DC plan and follow up therapy as arranged by surgeon;Supervision for mobility/OOB     Equipment Recommendations  Rolling walker with 5" wheels;3in1 (PT) (in room)    Recommendations for Other Services       Precautions / Restrictions Precautions Precautions: Knee Precaution Comments: instructed on KI use for amb Required Braces or Orthoses: Knee Immobilizer - Left Restrictions Weight Bearing Restrictions: No LLE Weight Bearing: Weight bearing as tolerated    Mobility  Bed Mobility Overal bed mobility: Needs Assistance Bed Mobility: Supine to Sit     Supine to sit: Min assist;Mod assist     General bed mobility comments: assist L LE and increased time to scoot to EOB  Transfers Overall transfer level: Needs assistance Equipment used: Rolling walker (2 wheeled) Transfers: Sit to/from Stand Sit to Stand: Min guard Stand pivot transfers: Min assist       General transfer comment: min/guard with heavy cueing for technique from recliner and BSC  Ambulation/Gait Ambulation/Gait assistance: Min guard Ambulation Distance (Feet): 20 Feet Assistive device: Rolling walker (2 wheeled) Gait Pattern/deviations: Step-to pattern;Antalgic;Decreased stance time - left Gait velocity: decreased   General Gait Details: Pt limited by nausea.  She reports no increase in pain.   Stairs            Wheelchair Mobility    Modified Rankin (Stroke Patients Only)       Balance           Standing balance support: Bilateral upper extremity supported Standing balance-Leahy  Scale: Poor                              Cognition Arousal/Alertness: Awake/alert Behavior During Therapy: WFL for tasks assessed/performed Overall Cognitive Status: Within Functional Limits for tasks assessed                                        Exercises Total Joint Exercises Ankle Circles/Pumps: AROM;Both;20 reps Quad Sets: Strengthening;Left;10 reps Towel Squeeze: Both;10 reps Heel Slides: AAROM;Left;10 reps Hip ABduction/ADduction: AAROM;Left;10 reps Straight Leg Raises: Left;5 reps;AAROM    General Comments        Pertinent Vitals/Pain Pain Assessment: 0-10 Pain Score: 3  Pain Location: L knee Pain Descriptors / Indicators: Operative site guarding;Sore Pain Intervention(s): Limited activity within patient's tolerance;Monitored during session;Repositioned;Ice applied    Home Living                      Prior Function            PT Goals (current goals can now be found in the care plan section) Acute Rehab PT Goals Patient Stated Goal: to d/c home  PT Goal Formulation: With patient/family Time For Goal Achievement: 01/19/17 Potential to Achieve Goals: Good Progress towards PT goals: Progressing toward goals    Frequency    7X/week      PT Plan Current plan  remains appropriate    Co-evaluation              AM-PAC PT "6 Clicks" Daily Activity  Outcome Measure  Difficulty turning over in bed (including adjusting bedclothes, sheets and blankets)?: Total Difficulty moving from lying on back to sitting on the side of the bed? : Total Difficulty sitting down on and standing up from a chair with arms (e.g., wheelchair, bedside commode, etc,.)?: A Lot Help needed moving to and from a bed to chair (including a wheelchair)?: A Little Help needed walking in hospital room?: A Little Help needed climbing 3-5 steps with a railing? : A Lot 6 Click Score: 12    End of Session Equipment Utilized During Treatment: Gait  belt;Left knee immobilizer Activity Tolerance: Other (comment) (limited by nausea) Patient left: in chair;with call bell/phone within reach;with chair alarm set;with family/visitor present Nurse Communication:  (pt nausea) PT Visit Diagnosis: Muscle weakness (generalized) (M62.81);Difficulty in walking, not elsewhere classified (R26.2);Pain Pain - Right/Left: Left Pain - part of body: Knee     Time: 1610-9604 PT Time Calculation (min) (ACUTE ONLY): 31 min  Charges:  $Gait Training: 8-22 mins $Therapeutic Exercise: 8-22 mins $Therapeutic Activity: 8-22 mins                    G Codes:       Loron Weimer L. Katrinka Blazing, [place] Pager [phone] 01/06/2017    Enzo Montgomery 01/06/2017, 2:20 PM

## 2017-01-07 LAB — BASIC METABOLIC PANEL
Anion gap: 9 (ref 5–15)
BUN: 10 mg/dL (ref 6–20)
CO2: 26 mmol/L (ref 22–32)
CREATININE: 0.83 mg/dL (ref 0.44–1.00)
Calcium: 8.9 mg/dL (ref 8.9–10.3)
Chloride: 100 mmol/L — ABNORMAL LOW (ref 101–111)
GFR calc Af Amer: >60 mL/min (ref >60)
Glucose, Bld: 162 mg/dL — ABNORMAL HIGH (ref 65–99)
POTASSIUM: 4 mmol/L (ref 3.5–5.1)
SODIUM: 135 mmol/L (ref 135–145)

## 2017-01-07 LAB — CBC
HCT: 32.8 % — ABNORMAL LOW (ref 36.0–46.0)
Hemoglobin: 10.6 g/dL — ABNORMAL LOW (ref 12.0–15.0)
MCH: 27 pg (ref 26.0–34.0)
MCHC: 32.3 g/dL (ref 30.0–36.0)
MCV: 83.5 fL (ref 78.0–100.0)
PLATELETS: 239 10*3/uL (ref 150–400)
RBC: 3.93 MIL/uL (ref 3.87–5.11)
RDW: 14.7 % (ref 11.5–15.5)
WBC: 14 10*3/uL — ABNORMAL HIGH (ref 4.0–10.5)

## 2017-01-07 LAB — GLUCOSE, CAPILLARY
GLUCOSE-CAPILLARY: 136 mg/dL — AB (ref 65–99)
Glucose-Capillary: 127 mg/dL — ABNORMAL HIGH (ref 65–99)

## 2017-01-07 MED ORDER — SODIUM CHLORIDE 0.9 % IV BOLUS (SEPSIS)
250.0000 mL | Freq: Once | INTRAVENOUS | Status: AC
  Administered 2017-01-07: 250 mL via INTRAVENOUS

## 2017-01-07 NOTE — Progress Notes (Signed)
Physical Therapy Treatment Patient Details Name: Shelby Duke MRN: [id number] DOB: 1951/02/06 Today's Date: 01/07/2017    History of Present Illness 66 y.o. female admitted on 01/05/17 for elective L TKA.  Pt with significant PMH of HTN and DM.      PT Comments    POD # 2 pm session Assisted to bathroom when pt c/o dizziness and requested to sit.  Required extended rest break before practicing stairs again.  After completing 4 steps using one rail/one crutch pt noted to be diaphoretic and mild dyspnea.  Vitals take to find HR 149.  Pt returned to room in recliner and reported to RN.   Follow Up Recommendations  DC plan and follow up therapy as arranged by surgeon;Supervision for mobility/OOB     Equipment Recommendations  Rolling walker with 5" wheels;3in1 (PT)    Recommendations for Other Services       Precautions / Restrictions Precautions Precautions: Knee Precaution Comments: instructed on KI use for amb and stairs (just a short walk to BR no need to wear) Required Braces or Orthoses: Knee Immobilizer - Left Restrictions Weight Bearing Restrictions: No LLE Weight Bearing: Weight bearing as tolerated    Mobility  Bed Mobility               General bed mobility comments: OOB in recliner  Transfers Overall transfer level: Needs assistance Equipment used: Rolling walker (2 wheeled) Transfers: Sit to/from Stand Sit to Stand: Supervision;Min guard Stand pivot transfers: Supervision;Min guard       General transfer comment: increased time  Ambulation/Gait Ambulation/Gait assistance: Supervision;Min guard Ambulation Distance (Feet): 22 Feet Assistive device: Rolling walker (2 wheeled) Gait Pattern/deviations: Step-to pattern;Antalgic;Decreased stance time - left Gait velocity: decreased   General Gait Details: limited by mild c/o dizziness and fatigue along with diaphoresis(feeling hot) assisted to bathroom pt requested to sit  immediately.   Stairs Stairs: Yes   Stair Management: One rail Left;Step to pattern;Forwards;With crutches Number of Stairs: 4 General stair comments: up 4 steps wearing KI for increased support.  One L rail and one R crutch with 25% VC's on proper sequencing and safety.  Noted increased fatigue and diaphoresis.  HR taken at 149.   Wheelchair Mobility    Modified Rankin (Stroke Patients Only)       Balance                                            Cognition Arousal/Alertness: Awake/alert Behavior During Therapy: WFL for tasks assessed/performed Overall Cognitive Status: Within Functional Limits for tasks assessed                                        Exercises      General Comments        Pertinent Vitals/Pain Pain Assessment: 0-10 Pain Score: 4  Pain Location: L knee Pain Descriptors / Indicators: Operative site guarding;Tender;Sore Pain Intervention(s): Monitored during session    Home Living                      Prior Function            PT Goals (current goals can now be found in the care plan section) Progress towards PT goals: Progressing toward goals  Frequency    7X/week      PT Plan Current plan remains appropriate    Co-evaluation              AM-PAC PT "6 Clicks" Daily Activity  Outcome Measure  Difficulty turning over in bed (including adjusting bedclothes, sheets and blankets)?: Total Difficulty moving from lying on back to sitting on the side of the bed? : Total Difficulty sitting down on and standing up from a chair with arms (e.g., wheelchair, bedside commode, etc,.)?: A Lot Help needed moving to and from a bed to chair (including a wheelchair)?: A Little Help needed walking in hospital room?: A Little Help needed climbing 3-5 steps with a railing? : A Lot 6 Click Score: 12    End of Session Equipment Utilized During Treatment: Gait belt;Left knee immobilizer Activity  Tolerance: Patient limited by fatigue;Other (comment) (mild dizziness) Patient left: in chair;with call bell/phone within reach;with chair alarm set;with family/visitor present   PT Visit Diagnosis: Muscle weakness (generalized) (M62.81);Difficulty in walking, not elsewhere classified (R26.2);Pain Pain - Right/Left: Left Pain - part of body: Knee     Time: 1330-1355 PT Time Calculation (min) (ACUTE ONLY): 25 min  Charges:  $Gait Training: 8-22 mins $Therapeutic Activity: 8-22 mins                    G Codes:       Felecia Shelling  PTA [facility]  Acute  Rehab Pager      [phone]

## 2017-01-07 NOTE — Progress Notes (Signed)
Subjective: 2 Days Post-Op Procedure(s) (LRB): LEFT TOTAL KNEE ARTHROPLASTY (Left) Patient reports pain as 1 on 0-10 scale. Doing very well today. Will DC   Objective: Vital signs in last 24 hours: Temp:  [98.3 F (36.8 C)-99.8 F (37.7 C)] 99.8 F (37.7 C) (06/15 0532) Pulse Rate:  [87-116] 116 (06/15 0532) Resp:  [17-18] 17 (06/15 0532) BP: (142-158)/(68-81) 142/81 (06/15 0532) SpO2:  [95 %-97 %] 95 % (06/15 0532)  Intake/Output from previous day: 06/14 0701 - 06/15 0700 In: 1080 [P.O.:1080] Out: 950 [Urine:950] Intake/Output this shift: No intake/output data recorded.   Recent Labs  01/06/17 0441 01/07/17 0433  HGB 11.5* 10.6*    Recent Labs  01/06/17 0441 01/07/17 0433  WBC 14.2* 14.0*  RBC 4.24 3.93  HCT 35.7* 32.8*  PLT 269 239    Recent Labs  01/06/17 0441 01/07/17 0433  NA 139 135  K 4.1 4.0  CL 103 100*  CO2 26 26  BUN 12 10  CREATININE 0.78 0.83  GLUCOSE 145* 162*  CALCIUM 8.9 8.9   No results for input(s): LABPT, INR in the last 72 hours.  Dorsiflexion/Plantar flexion intact  Assessment/Plan: 2 Days Post-Op Procedure(s) (LRB): LEFT TOTAL KNEE ARTHROPLASTY (Left) Discharge home today.  Shelby Duke A 01/07/2017, 7:12 AM

## 2017-01-07 NOTE — Progress Notes (Signed)
Occupational Therapy Treatment Patient Details Name: Shelby Duke MRN: [id number] DOB: [birth year] Today's Date: 01/07/2017    History of present illness 66 y.o. female admitted on 01/05/17 for elective L TKA.  Pt with significant PMH of HTN and DM.     OT comments  All education completed this session.  Encouraged rest breaks as pt gets hot with activity.  No nausea  Follow Up Recommendations  Supervision/Assistance - 24 hour    Equipment Recommendations  3 in 1 bedside commode    Recommendations for Other Services      Precautions / Restrictions Precautions Precautions: Knee Precaution Comments: instructed on KI use for amb Required Braces or Orthoses: Knee Immobilizer - Left Restrictions Weight Bearing Restrictions: Yes LLE Weight Bearing: Weight bearing as tolerated       Mobility Bed Mobility                  Transfers   Equipment used: Rolling walker (2 wheeled)   Sit to Stand: Supervision         General transfer comment: cues for UE/LE placement    Balance                                           ADL either performed or assessed with clinical judgement   ADL       Grooming: Wash/dry hands;Wash/dry face;Standing;Supervision/safety                   Toilet Transfer: Supervision/safety;Ambulation;BSC;RW   Toileting- Clothing Manipulation and Hygiene: Supervision/safety;Sit to/from stand   Tub/ Shower Transfer: Walk-in shower;Min guard;Ambulation;3 in 1     General ADL Comments: simulated shower ledge as pt's ledge is shorter than ours. She is having difficulty lifting RLE high enough; she may need to sponge bathe initially, and pt is agreeable with this.  Reviewed knee precautions and safety     Vision       Perception     Praxis      Cognition Arousal/Alertness: Awake/alert Behavior During Therapy: WFL for tasks assessed/performed Overall Cognitive Status: Within Functional Limits for tasks assessed                                           Exercises     Shoulder Instructions       General Comments      Pertinent Vitals/ Pain       Pain Score: 2  Pain Location: L knee Pain Descriptors / Indicators: Sore Pain Intervention(s): Limited activity within patient's tolerance;Monitored during session;Premedicated before session;Repositioned;Ice applied  Home Living                                          Prior Functioning/Environment              Frequency           Progress Toward Goals  OT Goals(current goals can now be found in the care plan section)  Progress towards OT goals: Progressing toward goals  Acute Rehab OT Goals Patient Stated Goal: to d/c home   Plan      Co-evaluation  AM-PAC PT "6 Clicks" Daily Activity     Outcome Measure   Help from another person eating meals?: None Help from another person taking care of personal grooming?: A Little Help from another person toileting, which includes using toliet, bedpan, or urinal?: A Little Help from another person bathing (including washing, rinsing, drying)?: A Lot Help from another person to put on and taking off regular upper body clothing?: A Little Help from another person to put on and taking off regular lower body clothing?: A Lot 6 Click Score: 17    End of Session    OT Visit Diagnosis: Pain Pain - Right/Left: Left Pain - part of body: Knee   Activity Tolerance Patient limited by fatigue   Patient Left in chair;with call bell/phone within reach;with chair alarm set   Nurse Communication          Time: 4098-1191 OT Time Calculation (min): 17 min  Charges: OT General Charges $OT Visit: 1 Procedure OT Treatments $Self Care/Home Management : 8-22 mins  Marica Otter, OTR/L [phone] 01/07/2017   Shelby Duke 01/07/2017, 9:26 AM

## 2017-01-07 NOTE — Progress Notes (Signed)
Physical Therapy Treatment Patient Details Name: TERRIANA BARRERAS MRN: [id number] DOB: 10/20/1950 Today's Date: 01/07/2017    History of Present Illness 66 y.o. female admitted on 01/05/17 for elective L TKA.  Pt with significant PMH of HTN and DM.      PT Comments    POD # 2 am session Applied KI and instructed on use for walking and esp stairs.  Assisted with amb a limited distance in hallway due to fatigue.  Practiced stairs, one L rail and one R crutch.  Pt completed 4 steps with much effort and c/o dizziness.  Chair brought to pt and returned to room. Pt will need another pt session to perform stairs again prior to D/C.  Follow Up Recommendations  DC plan and follow up therapy as arranged by surgeon;Supervision for mobility/OOB     Equipment Recommendations  Rolling walker with 5" wheels;3in1 (PT)    Recommendations for Other Services       Precautions / Restrictions Precautions Precautions: Knee Precaution Comments: instructed on KI use for amb and stairs (just a short walk to BR no need to wear) Required Braces or Orthoses: Knee Immobilizer - Left Restrictions Weight Bearing Restrictions: No LLE Weight Bearing: Weight bearing as tolerated    Mobility  Bed Mobility               General bed mobility comments: OOB in recliner  Transfers Overall transfer level: Needs assistance Equipment used: Rolling walker (2 wheeled) Transfers: Sit to/from Stand Sit to Stand: Supervision;Min guard Stand pivot transfers: Supervision;Min guard       General transfer comment: increased time  Ambulation/Gait Ambulation/Gait assistance: Supervision;Min guard Ambulation Distance (Feet): 22 Feet Assistive device: Rolling walker (2 wheeled) Gait Pattern/deviations: Step-to pattern;Antalgic;Decreased stance time - left Gait velocity: decreased   General Gait Details: limited by mild c/o dizziness and fatigue along with diaphoresis(feeling hot)   Stairs Stairs: Yes   Stair  Management: One rail Left;Step to pattern;Forwards;With crutches Number of Stairs: 4 General stair comments: up 4 steps wearing KI for increased support.  One L rail and one R crutch with 25% VC's on proper sequencing and safety.  Thias activity completely exhausted her so brought chair to her and rolled back to room.  Wheelchair Mobility    Modified Rankin (Stroke Patients Only)       Balance                                            Cognition Arousal/Alertness: Awake/alert Behavior During Therapy: WFL for tasks assessed/performed Overall Cognitive Status: Within Functional Limits for tasks assessed                                        Exercises      General Comments        Pertinent Vitals/Pain Pain Assessment: 0-10 Pain Score: 4  Pain Location: L knee Pain Descriptors / Indicators: Operative site guarding;Tender;Sore Pain Intervention(s): Monitored during session    Home Living                      Prior Function            PT Goals (current goals can now be found in the care plan section) Progress towards PT goals:  Progressing toward goals    Frequency    7X/week      PT Plan Current plan remains appropriate    Co-evaluation              AM-PAC PT "6 Clicks" Daily Activity  Outcome Measure  Difficulty turning over in bed (including adjusting bedclothes, sheets and blankets)?: Total Difficulty moving from lying on back to sitting on the side of the bed? : Total Difficulty sitting down on and standing up from a chair with arms (e.g., wheelchair, bedside commode, etc,.)?: A Lot Help needed moving to and from a bed to chair (including a wheelchair)?: A Little Help needed walking in hospital room?: A Little Help needed climbing 3-5 steps with a railing? : A Lot 6 Click Score: 12    End of Session Equipment Utilized During Treatment: Gait belt;Left knee immobilizer Activity Tolerance: Patient  limited by fatigue;Other (comment) (mild dizziness) Patient left: in chair;with call bell/phone within reach;with chair alarm set;with family/visitor present   PT Visit Diagnosis: Muscle weakness (generalized) (M62.81);Difficulty in walking, not elsewhere classified (R26.2);Pain Pain - Right/Left: Left Pain - part of body: Knee     Time: [id number] PT Time Calculation (min) (ACUTE ONLY): 29 min  Charges:  $Gait Training: 8-22 mins $Therapeutic Activity: 8-22 mins                    G Codes:       Felecia ShellingLori Chantz Montefusco  PTA [facility]  Acute  Rehab Pager      [phone]

## 2017-01-07 NOTE — Progress Notes (Signed)
Patient slightly dizzy and HR 140's while doing stairs with therapy. Patient HR 100 when resting after therapy. Patient states they have a history of this while using stairs. Dr. Darrelyn HillockGioffre notified. 250cc bolus ordered.

## 2017-01-10 NOTE — Discharge Summary (Signed)
Physician Discharge Summary   Patient ID: Shelby Duke MRN: [id number] DOB/AGE: 68-Jan-1952 68 y.o.  Admit date: 01/05/2017 Discharge date: 01/07/2017  Primary Diagnosis: Primary osteoarthritis left knee   Admission Diagnoses:  Past Medical History:  Diagnosis Date  . Arthritis   . Diabetes mellitus without complication ([place])    type 2  . Hyperlipidemia   . Hypertension    Discharge Diagnoses:   Active Problems:   History of total knee arthroplasty, left  Estimated body mass index is 41.88 kg/m as calculated from the following:   Height as of this encounter: _0  (1.575 m).   Weight as of this encounter: 103.9 kg (229 lb).  Procedure:  Procedure(s) (LRB): LEFT TOTAL KNEE ARTHROPLASTY (Left)   Consults: None  HPI: Shelby Duke, 66 y.o. female, has a history of pain and functional disability in the left knee due to arthritis and has failed non-surgical conservative treatments for greater than 12 weeks to includeNSAID's and/or analgesics, flexibility and strengthening excercises, use of assistive devices and activity modification.  Onset of symptoms was gradual, starting >10 years ago with gradually worsening course since that time. The patient noted no past surgery on the left knee(s).  Patient currently rates pain in the left knee(s) at 8 out of 10 with activity. Patient has night pain, worsening of pain with activity and weight bearing, pain that interferes with activities of daily living, pain with passive range of motion, crepitus and joint swelling.  Patient has evidence of periarticular osteophytes and joint space narrowing by imaging studies. There is no active infection.  Laboratory Data: Admission on 01/05/2017, Discharged on 01/07/2017  Component Date Value Ref Range Status  . Glucose-Capillary 01/05/2017 112* 65 - 99 mg/dL Final  . Comment 1 01/05/2017 Notify RN   Final  . Glucose-Capillary 01/05/2017 115* 65 - 99 mg/dL Final  . Comment 1 01/05/2017 Notify RN    Final  . Comment 2 01/05/2017 Document in Chart   Final  . WBC 01/06/2017 14.2* 4.0 - 10.5 K/uL Final  . RBC 01/06/2017 4.24  3.87 - 5.11 MIL/uL Final  . Hemoglobin 01/06/2017 11.5* 12.0 - 15.0 g/dL Final  . HCT 01/06/2017 35.7* 36.0 - 46.0 % Final  . MCV 01/06/2017 84.2  78.0 - 100.0 fL Final  . MCH 01/06/2017 27.1  26.0 - 34.0 pg Final  . MCHC 01/06/2017 32.2  30.0 - 36.0 g/dL Final  . RDW 01/06/2017 14.6  11.5 - 15.5 % Final  . Platelets 01/06/2017 269  150 - 400 K/uL Final  . Sodium 01/06/2017 139  135 - 145 mmol/L Final  . Potassium 01/06/2017 4.1  3.5 - 5.1 mmol/L Final  . Chloride 01/06/2017 103  101 - 111 mmol/L Final  . CO2 01/06/2017 26  22 - 32 mmol/L Final  . Glucose, Bld 01/06/2017 145* 65 - 99 mg/dL Final  . BUN 01/06/2017 12  6 - 20 mg/dL Final  . Creatinine, Ser 01/06/2017 0.78  0.44 - 1.00 mg/dL Final  . Calcium 01/06/2017 8.9  8.9 - 10.3 mg/dL Final  . GFR calc non Af Amer 01/06/2017 >60  >60 mL/min Final  . GFR calc Af Amer 01/06/2017 >60  >60 mL/min Final   Comment: (NOTE) The eGFR has been calculated using the CKD EPI equation. This calculation has not been validated in all clinical situations. eGFR's persistently <60 mL/min signify possible Chronic Kidney Disease.   . Anion gap 01/06/2017 10  5 - 15 Final  . Glucose-Capillary 01/05/2017 187* 65 -  99 mg/dL Final  . Glucose-Capillary 01/05/2017 184* 65 - 99 mg/dL Final  . Glucose-Capillary 01/06/2017 130* 65 - 99 mg/dL Final  . Glucose-Capillary 01/06/2017 158* 65 - 99 mg/dL Final  . WBC 01/07/2017 14.0* 4.0 - 10.5 K/uL Final  . RBC 01/07/2017 3.93  3.87 - 5.11 MIL/uL Final  . Hemoglobin 01/07/2017 10.6* 12.0 - 15.0 g/dL Final  . HCT 01/07/2017 32.8* 36.0 - 46.0 % Final  . MCV 01/07/2017 83.5  78.0 - 100.0 fL Final  . MCH 01/07/2017 27.0  26.0 - 34.0 pg Final  . MCHC 01/07/2017 32.3  30.0 - 36.0 g/dL Final  . RDW 01/07/2017 14.7  11.5 - 15.5 % Final  . Platelets 01/07/2017 239  150 - 400 K/uL Final  .  Sodium 01/07/2017 135  135 - 145 mmol/L Final  . Potassium 01/07/2017 4.0  3.5 - 5.1 mmol/L Final  . Chloride 01/07/2017 100* 101 - 111 mmol/L Final  . CO2 01/07/2017 26  22 - 32 mmol/L Final  . Glucose, Bld 01/07/2017 162* 65 - 99 mg/dL Final  . BUN 01/07/2017 10  6 - 20 mg/dL Final  . Creatinine, Ser 01/07/2017 0.83  0.44 - 1.00 mg/dL Final  . Calcium 01/07/2017 8.9  8.9 - 10.3 mg/dL Final  . GFR calc non Af Amer 01/07/2017 >60  >60 mL/min Final  . GFR calc Af Amer 01/07/2017 >60  >60 mL/min Final   Comment: (NOTE) The eGFR has been calculated using the CKD EPI equation. This calculation has not been validated in all clinical situations. eGFR's persistently <60 mL/min signify possible Chronic Kidney Disease.   . Anion gap 01/07/2017 9  5 - 15 Final  . Glucose-Capillary 01/06/2017 149* 65 - 99 mg/dL Final  . Glucose-Capillary 01/06/2017 171* 65 - 99 mg/dL Final  . Glucose-Capillary 01/07/2017 136* 65 - 99 mg/dL Final  . Glucose-Capillary 01/07/2017 127* 65 - 99 mg/dL Final  Hospital Outpatient Visit on 12/27/2016  Component Date Value Ref Range Status  . Glucose-Capillary 12/27/2016 110* 65 - 99 mg/dL Final  . aPTT 12/27/2016 36  24 - 36 seconds Final  . Prothrombin Time 12/27/2016 12.8  11.4 - 15.2 seconds Final  . INR 12/27/2016 0.96   Final  . ABO/RH(D) 12/27/2016 O POS   Final  . Antibody Screen 12/27/2016 NEG   Final  . Sample Expiration 12/27/2016 01/08/2017   Final  . Extend sample reason 12/27/2016 NO TRANSFUSIONS OR PREGNANCY IN THE PAST 3 MONTHS   Final  . Color, Urine 12/27/2016 YELLOW  YELLOW Final  . APPearance 12/27/2016 CLEAR  CLEAR Final  . Specific Gravity, Urine 12/27/2016 1.017  1.005 - 1.030 Final  . pH 12/27/2016 5.0  5.0 - 8.0 Final  . Glucose, UA 12/27/2016 NEGATIVE  NEGATIVE mg/dL Final  . Hgb urine dipstick 12/27/2016 NEGATIVE  NEGATIVE Final  . Bilirubin Urine 12/27/2016 NEGATIVE  NEGATIVE Final  . Ketones, ur 12/27/2016 NEGATIVE  NEGATIVE mg/dL Final    . Protein, ur 12/27/2016 NEGATIVE  NEGATIVE mg/dL Final  . Nitrite 12/27/2016 NEGATIVE  NEGATIVE Final  . Leukocytes, UA 12/27/2016 TRACE* NEGATIVE Final  . RBC / HPF 12/27/2016 0-5  0 - 5 RBC/hpf Final  . WBC, UA 12/27/2016 0-5  0 - 5 WBC/hpf Final  . Bacteria, UA 12/27/2016 NONE SEEN  NONE SEEN Final  . Squamous Epithelial / LPF 12/27/2016 6-30* NONE SEEN Final  . Mucous 12/27/2016 PRESENT   Final  . MRSA, PCR 12/27/2016 NEGATIVE  NEGATIVE Final  . Staphylococcus  aureus 12/27/2016 NEGATIVE  NEGATIVE Final   Comment:        The Xpert SA Assay (FDA approved for NASAL specimens in patients over 8 years of age), is one component of a comprehensive surveillance program.  Test performance has been validated by [facility] for patients greater than or equal to 80 year old. It is not intended to diagnose infection nor to guide or monitor treatment.   . ABO/RH(D) 12/27/2016 O POS   Final     X-Rays:No results found.  EKG:No orders found for this or any previous visit.   Hospital Course: LIELA RYLEE is a 66 y.o. who was admitted to [facility]. They were brought to the operating room on 01/05/2017 and underwent Procedure(s): LEFT TOTAL KNEE ARTHROPLASTY.  Patient tolerated the procedure well and was later transferred to the recovery room and then to the orthopaedic floor for postoperative care.  They were given PO and IV analgesics for pain control following their surgery.  They were given 24 hours of postoperative antibiotics of  Anti-infectives    Start     Dose/Rate Route Frequency Ordered Stop   01/05/17 1800  ceFAZolin (ANCEF) IVPB 1 g/50 mL premix     1 g 100 mL/hr over 30 Minutes Intravenous Every 6 hours 01/05/17 1524 01/06/17 0022   01/05/17 1146  polymyxin B 500,000 Units, bacitracin 50,000 Units in sodium chloride irrigation 0.9 % 500 mL irrigation  Status:  Discontinued       As needed 01/05/17 1146 01/05/17 1327   01/05/17 0853  ceFAZolin (ANCEF) IVPB 2g/100  mL premix     2 g 200 mL/hr over 30 Minutes Intravenous On call to O.R. 01/05/17 0488 01/05/17 1103     and started on DVT prophylaxis in the form of Xarelto.   PT and OT were ordered for total joint protocol.  Discharge planning consulted to help with postop disposition and equipment needs.  Patient had a good night on the evening of surgery.  They started to get up OOB with therapy on day one. Hemovac drain was pulled without difficulty.  Continued to work with therapy into day two.  Dressing was changed on day two and the incision was clean and dry. The patient had progressed with therapy and meeting their goals.  Incision was healing well.  Patient was seen in rounds and was ready to go home.   Diet: Cardiac diet and Diabetic diet Activity:WBAT Follow-up:in 2 weeks Disposition - Home Discharged Condition: stable   Discharge Instructions    Call MD / Call 911    Complete by:  As directed    If you experience chest pain or shortness of breath, CALL 911 and be transported to the hospital emergency room.  If you develope a fever above 101 F, pus (white drainage) or increased drainage or redness at the wound, or calf pain, call your surgeon's office.   Constipation Prevention    Complete by:  As directed    Drink plenty of fluids.  Prune juice may be helpful.  You may use a stool softener, such as Colace (over the counter) 100 mg twice a day.  Use MiraLax (over the counter) for constipation as needed.   Diet - low sodium heart healthy    Complete by:  As directed    Diet Carb Modified    Complete by:  As directed    Discharge instructions    Complete by:  As directed    INSTRUCTIONS AFTER JOINT  REPLACEMENT   Remove items at home which could result in a fall. This includes throw rugs or furniture in walking pathways ICE to the affected joint every three hours while awake for 30 minutes at a time, for at least the first 3-5 days, and then as needed for pain and swelling.  Continue to use  ice for pain and swelling. You may notice swelling that will progress down to the foot and ankle.  This is normal after surgery.  Elevate your leg when you are not up walking on it.   Continue to use the breathing machine you got in the hospital (incentive spirometer) which will help keep your temperature down.  It is common for your temperature to cycle up and down following surgery, especially at night when you are not up moving around and exerting yourself.  The breathing machine keeps your lungs expanded and your temperature down.   DIET:  As you were doing prior to hospitalization, we recommend a well-balanced diet.  DRESSING / WOUND CARE / SHOWERING  You may change your dressing every day with sterile gauze.  Please use good hand washing techniques before changing the dressing.  Do not use any lotions or creams on the incision until instructed by your surgeon.  ACTIVITY  Increase activity slowly as tolerated, but follow the weight bearing instructions below.   No driving for 6 weeks or until further direction given by your physician.  You cannot drive while taking narcotics.  No lifting or carrying greater than 10 lbs. until further directed by your surgeon. Avoid periods of inactivity such as sitting longer than an hour when not asleep. This helps prevent blood clots.  You may return to work once you are authorized by your doctor.     WEIGHT BEARING   Weight bearing as tolerated with assist device (walker, cane, etc) as directed, use it as long as suggested by your surgeon or therapist, typically at least 4-6 weeks.   EXERCISES  Results after joint replacement surgery are often greatly improved when you follow the exercise, range of motion and muscle strengthening exercises prescribed by your doctor. Safety measures are also important to protect the joint from further injury. Any time any of these exercises cause you to have increased pain or swelling, decrease what you are doing  until you are comfortable again and then slowly increase them. If you have problems or questions, call your caregiver or physical therapist for advice.   Rehabilitation is important following a joint replacement. After just a few days of immobilization, the muscles of the leg can become weakened and shrink (atrophy).  These exercises are designed to build up the tone and strength of the thigh and leg muscles and to improve motion. Often times heat used for twenty to thirty minutes before working out will loosen up your tissues and help with improving the range of motion but do not use heat for the first two weeks following surgery (sometimes heat can increase post-operative swelling).   These exercises can be done on a training (exercise) mat, on the floor, on a table or on a bed. Use whatever works the best and is most comfortable for you.    Use music or television while you are exercising so that the exercises are a pleasant break in your day. This will make your life better with the exercises acting as a break in your routine that you can look forward to.   Perform all exercises about fifteen times, three times  per day or as directed.  You should exercise both the operative leg and the other leg as well.  Exercises include:   Quad Sets - Tighten up the muscle on the front of the thigh (Quad) and hold for 5-10 seconds.   Straight Leg Raises - With your knee straight (if you were given a brace, keep it on), lift the leg to 60 degrees, hold for 3 seconds, and slowly lower the leg.  Perform this exercise against resistance later as your leg gets stronger.  Leg Slides: Lying on your back, slowly slide your foot toward your buttocks, bending your knee up off the floor (only go as far as is comfortable). Then slowly slide your foot back down until your leg is flat on the floor again.  Angel Wings: Lying on your back spread your legs to the side as far apart as you can without causing discomfort.  Hamstring  Strength:  Lying on your back, push your heel against the floor with your leg straight by tightening up the muscles of your buttocks.  Repeat, but this time bend your knee to a comfortable angle, and push your heel against the floor.  You may put a pillow under the heel to make it more comfortable if necessary.   A rehabilitation program following joint replacement surgery can speed recovery and prevent re-injury in the future due to weakened muscles. Contact your doctor or a physical therapist for more information on knee rehabilitation.    CONSTIPATION  Constipation is defined medically as fewer than three stools per week and severe constipation as less than one stool per week.  Even if you have a regular bowel pattern at home, your normal regimen is likely to be disrupted due to multiple reasons following surgery.  Combination of anesthesia, postoperative narcotics, change in appetite and fluid intake all can affect your bowels.   YOU MUST use at least one of the following options; they are listed in order of increasing strength to get the job done.  They are all available over the counter, and you may need to use some, POSSIBLY even all of these options:    Drink plenty of fluids (prune juice may be helpful) and high fiber foods Colace 100 mg by mouth twice a day  Senokot for constipation as directed and as needed Dulcolax (bisacodyl), take with full glass of water  Miralax (polyethylene glycol) once or twice a day as needed.  If you have tried all these things and are unable to have a bowel movement in the first 3-4 days after surgery call either your surgeon or your primary doctor.    If you experience loose stools or diarrhea, hold the medications until you stool forms back up.  If your symptoms do not get better within 1 week or if they get worse, check with your doctor.  If you experience "the worst abdominal pain ever" or develop nausea or vomiting, please contact the office immediately  for further recommendations for treatment.   ITCHING:  If you experience itching with your medications, try taking only a single pain pill, or even half a pain pill at a time.  You can also use Benadryl over the counter for itching or also to help with sleep.   TED HOSE STOCKINGS:  Use stockings on both legs until for at least 2 weeks or as directed by physician office. They may be removed at night for sleeping.  MEDICATIONS:  See your medication summary on the "After Visit Summary"  that nursing will review with you.  You may have some home medications which will be placed on hold until you complete the course of blood thinner medication.  It is important for you to complete the blood thinner medication as prescribed.  PRECAUTIONS:  If you experience chest pain or shortness of breath - call 911 immediately for transfer to the hospital emergency department.   If you develop a fever greater that 101 F, purulent drainage from wound, increased redness or drainage from wound, foul odor from the wound/dressing, or calf pain - CONTACT YOUR SURGEON.                                                   FOLLOW-UP APPOINTMENTS:  If you do not already have a post-op appointment, please call the office for an appointment to be seen by your surgeon.  Guidelines for how soon to be seen are listed in your "After Visit Summary", but are typically between 1-4 weeks after surgery.  OTHER INSTRUCTIONS:  Do not take vitamins or supplements while taking Xarelto    MAKE SURE YOU:  Understand these instructions.  Get help right away if you are not doing well or get worse.    Thank you for letting us be a part of your medical care team.  It is a privilege we respect greatly.  We hope these instructions will help you stay on track for a fast and full recovery!   Increase activity slowly as tolerated    Complete by:  As directed      Allergies as of 01/07/2017      Reactions   Aspirin Nausea Only, Other (See  Comments)   Acid indigestion      Medication List    STOP taking these medications   Vitamin D (Ergocalciferol) 50000 units Caps capsule Commonly known as:  DRISDOL     TAKE these medications   amLODipine 10 MG tablet Commonly known as:  NORVASC Take 10 mg by mouth daily at 6 PM. Between 1700-1800   JANUMET XR [phone] MG Tb24 Generic drug:  SitaGLIPtin-MetFORMIN HCl Take 1 tablet by mouth daily at 6 PM. 1600-1700   Menthol-Camphor 3-3 % Gel Apply 1 application topically 4 (four) times daily as needed (for knee pain.). Sore No More Warm Therapy Natural Pain Relieving Gel   methocarbamol 500 MG tablet Commonly known as:  ROBAXIN Take 1 tablet (500 mg total) by mouth every 6 (six) hours as needed for muscle spasms.   oxyCODONE-acetaminophen 5-325 MG tablet Commonly known as:  PERCOCET/ROXICET Take 1-2 tablets by mouth every 4 (four) hours as needed for moderate pain.   rivaroxaban 10 MG Tabs tablet Commonly known as:  XARELTO Take 1 tablet (10 mg total) by mouth daily with breakfast.   rosuvastatin 10 MG tablet Commonly known as:  CRESTOR Take 10 mg by mouth daily.   telmisartan 80 MG tablet Commonly known as:  MICARDIS Take 80 mg by mouth daily at 6 PM. Between 1700-1800      Follow-up Information    Latanya Maudlin, MD. Schedule an appointment as soon as possible for a visit in 2 week(s).   Specialty:  Orthopedic Surgery Contact information: [address] [place] [address] [phone]           Signed: Ardeen Jourdain, PA-C Orthopaedic Surgery 01/10/2017, 11:03  AM   

## 2017-07-06 ENCOUNTER — Other Ambulatory Visit: Payer: Self-pay | Admitting: Family Medicine

## 2017-07-06 DIAGNOSIS — Z1231 Encounter for screening mammogram for malignant neoplasm of breast: Secondary | ICD-10-CM

## 2017-08-04 ENCOUNTER — Ambulatory Visit
Admission: RE | Admit: 2017-08-04 | Discharge: 2017-08-04 | Disposition: A | Discharge status: Home / Self Care | Payer: Medicare Other | Source: Ambulatory Visit | Attending: Family Medicine | Admitting: Family Medicine

## 2017-08-04 DIAGNOSIS — Z1231 Encounter for screening mammogram for malignant neoplasm of breast: Secondary | ICD-10-CM

## 2018-09-25 ENCOUNTER — Other Ambulatory Visit: Payer: Self-pay | Admitting: Family Medicine

## 2018-09-25 DIAGNOSIS — Z1231 Encounter for screening mammogram for malignant neoplasm of breast: Secondary | ICD-10-CM

## 2018-09-26 ENCOUNTER — Ambulatory Visit
Admission: RE | Admit: 2018-09-26 | Discharge: 2018-09-26 | Disposition: A | Discharge status: Home / Self Care | Payer: Medicare Other | Source: Ambulatory Visit | Attending: Family Medicine | Admitting: Family Medicine

## 2018-09-26 DIAGNOSIS — Z1231 Encounter for screening mammogram for malignant neoplasm of breast: Secondary | ICD-10-CM

## 2018-10-23 ENCOUNTER — Other Ambulatory Visit: Payer: Self-pay

## 2018-10-23 NOTE — Patient Outreach (Signed)
[facility]) Care Management  10/23/2018  Shelby Duke Jul 03, 1951 [id number]   Medication Adherence call to Shelby Duke spoke with patient she is due on Janumet 100/1000 she explain she already order this medication from [facility] but, has not pick up. Shelby Duke wants more information on Patients Assistance I will refer it to [name] (rph) Shelby Duke is showing past due under [facility] Ins.   Shelby Duke CPhT Pharmacy Technician [facility] Care Management Direct Dial [phone]  Fax [phone] [name]

## 2018-11-02 ENCOUNTER — Other Ambulatory Visit: Payer: Self-pay | Admitting: Pharmacist

## 2018-11-02 NOTE — Patient Outreach (Signed)
[facility]) Care Management  11/02/2018  Shelby Duke 04-17-1951 [id number]  Patient was referred for medication assistance evaluation by pharmacy technician Ana.   Outreach call placed to patient, patient verified name, no other PHI was exchanged and patient reported the phone had a bad connection.   Attempted to place call to patient on office line and patient did not answer, a HIPAA compliant message was left requesting a return call.   Plan:  Will make an additional outreach next week.   Tommye Standard, PharmD, [facility] Clinical Pharmacist [facility] [phone]

## 2018-12-08 ENCOUNTER — Other Ambulatory Visit: Payer: Self-pay | Admitting: Pharmacist

## 2018-12-08 NOTE — Patient Outreach (Signed)
[facility]) Care Management  12/08/2018  AMIR WITZ [birth year] [id number]  Unsuccessful outreach to patient.  HIPAA compliant message left requesting return call.   Plan:  Will make final phone outreach attempt in the next two weeks.   Tommye Standard, PharmD, [facility] Clinical Pharmacist [facility] [phone]

## 2018-12-20 ENCOUNTER — Other Ambulatory Visit: Payer: Self-pay | Admitting: Pharmacist

## 2018-12-20 NOTE — Patient Outreach (Signed)
[facility]) Care Management  12/20/2018  Shelby Duke 1951/06/30 [id number]  Successful phone outreach to patient. HIPAA details verified. Explained to patient pharmacist received a message patient may be interested in medication patient assistance for Janumet. Today patient reports cost of Janumet is not a problem and denies questions/concerns with medications.   Plan:   Patient encouraged to contact pharmacist if questions arise in the future.   Case closed.   Tommye Standard, PharmD, [facility] Clinical Pharmacist [facility] [phone]

## 2019-01-23 DIAGNOSIS — Z96652 Presence of left artificial knee joint: Secondary | ICD-10-CM | POA: Insufficient documentation

## 2019-02-06 ENCOUNTER — Telehealth: Payer: Self-pay | Admitting: *Deleted

## 2019-02-06 ENCOUNTER — Telehealth: Payer: Self-pay | Admitting: Family Medicine

## 2019-02-06 DIAGNOSIS — Z20822 Contact with and (suspected) exposure to covid-19: Secondary | ICD-10-CM

## 2019-02-06 NOTE — Telephone Encounter (Signed)
COVID-19 Testing Request:  Office name - [facility] Requesting Provider - Patient Contact number [phone]  Reason for request - Symptomatic

## 2019-02-06 NOTE — Telephone Encounter (Signed)
Patient PCP request COVID testing for patient- call to patient and she has been scheduled. Orders placed.

## 2019-02-07 ENCOUNTER — Other Ambulatory Visit: Payer: Medicare Other

## 2019-02-07 DIAGNOSIS — Z20822 Contact with and (suspected) exposure to covid-19: Secondary | ICD-10-CM

## 2019-02-11 LAB — NOVEL CORONAVIRUS, NAA: SARS-CoV-2, NAA: NOT DETECTED

## 2019-08-19 ENCOUNTER — Ambulatory Visit: Payer: Self-pay | Attending: Internal Medicine

## 2019-08-19 DIAGNOSIS — Z23 Encounter for immunization: Secondary | ICD-10-CM | POA: Insufficient documentation

## 2019-08-20 NOTE — Progress Notes (Signed)
   Covid-19 Vaccination Clinic  Name:  Shelby Duke    MRN: [id number] DOB: 04/01/1951  08/19/2019  Shelby Duke was observed post Covid-19 immunization for 15 minutes without incidence. She was provided with Vaccine Information Sheet and instruction to access the V-Safe system.   Shelby Duke was instructed to call 911 with any severe reactions post vaccine: Marland Kitchen Difficulty breathing  . Swelling of your face and throat  . A fast heartbeat  . A bad rash all over your body  . Dizziness and weakness    Immunizations Administered    Name Date Dose VIS Date Route   Moderna COVID-19 Vaccine 08/19/2019  4:05 PM 0.5 mL 06/26/2019 Intramuscular   Manufacturer: Gala Murdoch   Lot: [id number]   NDC: 24497-530-05      Documented on behalf of: C. Hubbard

## 2019-09-16 ENCOUNTER — Ambulatory Visit: Payer: Self-pay | Attending: Internal Medicine

## 2019-09-16 DIAGNOSIS — Z23 Encounter for immunization: Secondary | ICD-10-CM | POA: Insufficient documentation

## 2019-09-16 NOTE — Progress Notes (Signed)
   Covid-19 Vaccination Clinic  Name:  Shelby Duke    MRN: [id number] DOB: Jul 19, 1951  09/16/2019  [name] was observed post Covid-19 immunization for 15 minutes without incidence. She was provided with Vaccine Information Sheet and instruction to access the V-Safe system.   [name] was instructed to call 911 with any severe reactions post vaccine: Marland Kitchen Difficulty breathing  . Swelling of your face and throat  . A fast heartbeat  . A bad rash all over your body  . Dizziness and weakness    Immunizations Administered    Name Date Dose VIS Date Route   Moderna COVID-19 Vaccine 09/16/2019  2:27 PM 0.5 mL 06/26/2019 Intramuscular   Manufacturer: Moderna   Lot: [id number]   NDC: 17530-104-04

## 2019-10-10 DIAGNOSIS — M130 Polyarthritis, unspecified: Secondary | ICD-10-CM | POA: Diagnosis not present

## 2019-10-10 DIAGNOSIS — R635 Abnormal weight gain: Secondary | ICD-10-CM | POA: Diagnosis not present

## 2019-10-10 DIAGNOSIS — E1169 Type 2 diabetes mellitus with other specified complication: Secondary | ICD-10-CM | POA: Diagnosis not present

## 2019-10-10 DIAGNOSIS — I10 Essential (primary) hypertension: Secondary | ICD-10-CM | POA: Diagnosis not present

## 2019-10-10 DIAGNOSIS — E785 Hyperlipidemia, unspecified: Secondary | ICD-10-CM | POA: Diagnosis not present

## 2019-10-12 DIAGNOSIS — I10 Essential (primary) hypertension: Secondary | ICD-10-CM | POA: Diagnosis not present

## 2019-10-12 DIAGNOSIS — E785 Hyperlipidemia, unspecified: Secondary | ICD-10-CM | POA: Diagnosis not present

## 2019-10-12 DIAGNOSIS — E1169 Type 2 diabetes mellitus with other specified complication: Secondary | ICD-10-CM | POA: Diagnosis not present

## 2019-11-28 DIAGNOSIS — E1169 Type 2 diabetes mellitus with other specified complication: Secondary | ICD-10-CM | POA: Diagnosis not present

## 2019-11-28 DIAGNOSIS — I10 Essential (primary) hypertension: Secondary | ICD-10-CM | POA: Diagnosis not present

## 2019-11-28 DIAGNOSIS — E785 Hyperlipidemia, unspecified: Secondary | ICD-10-CM | POA: Diagnosis not present

## 2019-11-30 DIAGNOSIS — E785 Hyperlipidemia, unspecified: Secondary | ICD-10-CM | POA: Diagnosis not present

## 2019-11-30 DIAGNOSIS — E1169 Type 2 diabetes mellitus with other specified complication: Secondary | ICD-10-CM | POA: Diagnosis not present

## 2019-11-30 DIAGNOSIS — I10 Essential (primary) hypertension: Secondary | ICD-10-CM | POA: Diagnosis not present

## 2019-12-11 ENCOUNTER — Other Ambulatory Visit: Payer: Self-pay | Admitting: Family Medicine

## 2019-12-11 DIAGNOSIS — Z1231 Encounter for screening mammogram for malignant neoplasm of breast: Secondary | ICD-10-CM

## 2019-12-12 ENCOUNTER — Other Ambulatory Visit: Payer: Self-pay

## 2019-12-12 ENCOUNTER — Ambulatory Visit
Admission: RE | Admit: 2019-12-12 | Discharge: 2019-12-12 | Disposition: A | Discharge status: Home / Self Care | Payer: Medicare PPO | Source: Ambulatory Visit | Attending: Family Medicine | Admitting: Family Medicine

## 2019-12-12 DIAGNOSIS — Z1231 Encounter for screening mammogram for malignant neoplasm of breast: Secondary | ICD-10-CM

## 2020-02-29 DIAGNOSIS — E1169 Type 2 diabetes mellitus with other specified complication: Secondary | ICD-10-CM | POA: Diagnosis not present

## 2020-02-29 DIAGNOSIS — E785 Hyperlipidemia, unspecified: Secondary | ICD-10-CM | POA: Diagnosis not present

## 2020-02-29 DIAGNOSIS — I10 Essential (primary) hypertension: Secondary | ICD-10-CM | POA: Diagnosis not present

## 2020-03-03 DIAGNOSIS — R635 Abnormal weight gain: Secondary | ICD-10-CM | POA: Diagnosis not present

## 2020-03-03 DIAGNOSIS — Z0000 Encounter for general adult medical examination without abnormal findings: Secondary | ICD-10-CM | POA: Diagnosis not present

## 2020-03-03 DIAGNOSIS — E1169 Type 2 diabetes mellitus with other specified complication: Secondary | ICD-10-CM | POA: Diagnosis not present

## 2020-03-03 DIAGNOSIS — E785 Hyperlipidemia, unspecified: Secondary | ICD-10-CM | POA: Diagnosis not present

## 2020-03-03 DIAGNOSIS — I10 Essential (primary) hypertension: Secondary | ICD-10-CM | POA: Diagnosis not present

## 2020-03-03 DIAGNOSIS — Z96652 Presence of left artificial knee joint: Secondary | ICD-10-CM | POA: Diagnosis not present

## 2020-03-19 ENCOUNTER — Other Ambulatory Visit: Payer: Self-pay | Admitting: Radiology

## 2020-03-19 ENCOUNTER — Other Ambulatory Visit: Payer: Self-pay

## 2020-03-19 DIAGNOSIS — Z20822 Contact with and (suspected) exposure to covid-19: Secondary | ICD-10-CM

## 2020-03-20 LAB — NOVEL CORONAVIRUS, NAA: SARS-CoV-2, NAA: NOT DETECTED

## 2020-03-20 LAB — SARS-COV-2, NAA 2 DAY TAT

## 2020-05-06 DIAGNOSIS — I10 Essential (primary) hypertension: Secondary | ICD-10-CM | POA: Diagnosis not present

## 2020-05-06 DIAGNOSIS — E785 Hyperlipidemia, unspecified: Secondary | ICD-10-CM | POA: Diagnosis not present

## 2020-05-06 DIAGNOSIS — E1169 Type 2 diabetes mellitus with other specified complication: Secondary | ICD-10-CM | POA: Diagnosis not present

## 2020-05-08 DIAGNOSIS — E785 Hyperlipidemia, unspecified: Secondary | ICD-10-CM | POA: Diagnosis not present

## 2020-05-08 DIAGNOSIS — Z23 Encounter for immunization: Secondary | ICD-10-CM | POA: Diagnosis not present

## 2020-05-08 DIAGNOSIS — I10 Essential (primary) hypertension: Secondary | ICD-10-CM | POA: Diagnosis not present

## 2020-05-08 DIAGNOSIS — E1169 Type 2 diabetes mellitus with other specified complication: Secondary | ICD-10-CM | POA: Diagnosis not present

## 2020-05-13 DIAGNOSIS — Z23 Encounter for immunization: Secondary | ICD-10-CM | POA: Diagnosis not present

## 2020-05-14 DIAGNOSIS — Z961 Presence of intraocular lens: Secondary | ICD-10-CM | POA: Diagnosis not present

## 2020-05-14 DIAGNOSIS — H26492 Other secondary cataract, left eye: Secondary | ICD-10-CM | POA: Diagnosis not present

## 2020-05-14 DIAGNOSIS — H10413 Chronic giant papillary conjunctivitis, bilateral: Secondary | ICD-10-CM | POA: Diagnosis not present

## 2020-05-14 DIAGNOSIS — E119 Type 2 diabetes mellitus without complications: Secondary | ICD-10-CM | POA: Diagnosis not present

## 2020-05-22 DIAGNOSIS — Z1211 Encounter for screening for malignant neoplasm of colon: Secondary | ICD-10-CM | POA: Diagnosis not present

## 2020-05-22 DIAGNOSIS — K648 Other hemorrhoids: Secondary | ICD-10-CM | POA: Diagnosis not present

## 2020-05-22 DIAGNOSIS — Z86010 Personal history of colonic polyps: Secondary | ICD-10-CM | POA: Diagnosis not present

## 2020-07-21 DIAGNOSIS — Z96652 Presence of left artificial knee joint: Secondary | ICD-10-CM | POA: Diagnosis not present

## 2020-08-01 DIAGNOSIS — Z20822 Contact with and (suspected) exposure to covid-19: Secondary | ICD-10-CM | POA: Diagnosis not present

## 2020-08-10 DIAGNOSIS — Z20822 Contact with and (suspected) exposure to covid-19: Secondary | ICD-10-CM | POA: Diagnosis not present

## 2020-08-20 DIAGNOSIS — E1169 Type 2 diabetes mellitus with other specified complication: Secondary | ICD-10-CM | POA: Diagnosis not present

## 2020-08-20 DIAGNOSIS — E785 Hyperlipidemia, unspecified: Secondary | ICD-10-CM | POA: Diagnosis not present

## 2020-08-20 DIAGNOSIS — I10 Essential (primary) hypertension: Secondary | ICD-10-CM | POA: Diagnosis not present

## 2020-09-05 DIAGNOSIS — E1169 Type 2 diabetes mellitus with other specified complication: Secondary | ICD-10-CM | POA: Diagnosis not present

## 2020-09-05 DIAGNOSIS — I10 Essential (primary) hypertension: Secondary | ICD-10-CM | POA: Diagnosis not present

## 2020-12-05 DIAGNOSIS — E1169 Type 2 diabetes mellitus with other specified complication: Secondary | ICD-10-CM | POA: Diagnosis not present

## 2020-12-05 DIAGNOSIS — I10 Essential (primary) hypertension: Secondary | ICD-10-CM | POA: Diagnosis not present

## 2020-12-05 DIAGNOSIS — E785 Hyperlipidemia, unspecified: Secondary | ICD-10-CM | POA: Diagnosis not present

## 2020-12-08 DIAGNOSIS — I10 Essential (primary) hypertension: Secondary | ICD-10-CM | POA: Diagnosis not present

## 2020-12-08 DIAGNOSIS — E1169 Type 2 diabetes mellitus with other specified complication: Secondary | ICD-10-CM | POA: Diagnosis not present

## 2020-12-23 ENCOUNTER — Other Ambulatory Visit: Payer: Self-pay | Admitting: Family Medicine

## 2020-12-23 DIAGNOSIS — Z1231 Encounter for screening mammogram for malignant neoplasm of breast: Secondary | ICD-10-CM

## 2020-12-26 ENCOUNTER — Other Ambulatory Visit: Payer: Self-pay

## 2020-12-26 ENCOUNTER — Ambulatory Visit
Admission: RE | Admit: 2020-12-26 | Discharge: 2020-12-26 | Disposition: A | Discharge status: Home / Self Care | Payer: Medicare PPO | Source: Ambulatory Visit | Attending: Family Medicine | Admitting: Family Medicine

## 2020-12-26 DIAGNOSIS — Z1231 Encounter for screening mammogram for malignant neoplasm of breast: Secondary | ICD-10-CM

## 2021-02-18 ENCOUNTER — Other Ambulatory Visit: Payer: Self-pay | Admitting: Family Medicine

## 2021-02-18 ENCOUNTER — Other Ambulatory Visit: Payer: Self-pay

## 2021-02-18 ENCOUNTER — Encounter: Payer: Self-pay | Admitting: Family Medicine

## 2021-02-18 ENCOUNTER — Ambulatory Visit: Payer: Medicare PPO | Admitting: Family Medicine

## 2021-02-18 VITALS — BP 136/71 | HR 71 | Ht 62.0 in | Wt 232.0 lb

## 2021-02-18 DIAGNOSIS — L309 Dermatitis, unspecified: Secondary | ICD-10-CM

## 2021-02-18 DIAGNOSIS — Z860100 Personal history of colon polyps, unspecified: Secondary | ICD-10-CM | POA: Insufficient documentation

## 2021-02-18 DIAGNOSIS — E119 Type 2 diabetes mellitus without complications: Secondary | ICD-10-CM | POA: Insufficient documentation

## 2021-02-18 DIAGNOSIS — L280 Lichen simplex chronicus: Secondary | ICD-10-CM | POA: Diagnosis not present

## 2021-02-18 DIAGNOSIS — I10 Essential (primary) hypertension: Secondary | ICD-10-CM | POA: Insufficient documentation

## 2021-02-18 DIAGNOSIS — K648 Other hemorrhoids: Secondary | ICD-10-CM | POA: Insufficient documentation

## 2021-02-18 DIAGNOSIS — Z1211 Encounter for screening for malignant neoplasm of colon: Secondary | ICD-10-CM | POA: Insufficient documentation

## 2021-02-18 NOTE — Progress Notes (Signed)
Rash that comes and goes, but has now been here since June at times it has bumps

## 2021-02-18 NOTE — Progress Notes (Signed)
    Subjective:    Patient ID: Shelby Duke is a 70 y.o. female presenting with vaginal rash   on 02/18/2021  HPI: Had bumps or boils coming up on her vagina. This was before the pandemic, and thought it was fine. Developed bump on right side and using topical triple Abx and bacitracin.  Taking some po Abx and this seemed to keep it in check and then she has noted it flared even more.  Urine aggravates it.  Review of Systems  Constitutional:  Negative for chills and fever.  Respiratory:  Negative for shortness of breath.   Cardiovascular:  Negative for chest pain.  Gastrointestinal:  Negative for abdominal pain, nausea and vomiting.  Genitourinary:  Negative for dysuria.       Vulvar lesion  Skin:  Negative for rash.     Objective:    BP 136/71   Pulse 71   Ht 5\' 2"  (1.575 m)   Wt 232 lb (105.2 kg)   BMI 42.43 kg/m  Physical Exam Exam conducted with a chaperone present.  Constitutional:      General: She is not in acute distress.    Appearance: She is well-developed.  HENT:     Head: Normocephalic and atraumatic.  Eyes:     General: No scleral icterus. Cardiovascular:     Rate and Rhythm: Normal rate.  Pulmonary:     Effort: Pulmonary effort is normal.  Abdominal:     Palpations: Abdomen is soft.  Genitourinary:    Comments: Vulva with thickened area on right posterior vulva including buttock, see photo Musculoskeletal:     Cervical back: Neck supple.  Skin:    General: Skin is warm and dry.  Neurological:     Mental Status: She is alert and oriented to person, place, and time.       Procedure: Patient identified, informed consent signed, copy in chart, time out performed.    Area cleansed with Alcohol.  Injected with 1% Lidocaine with Epi.  2 mL. Area cleaned with Betadine and 4 mm punch biopsy performed without difficulty.  Hemostasis obtained with Silver Nitrate.  Patient tolerated procedure well.        Assessment & Plan:   Problem List Items  Addressed This Visit       Unprioritized   Vulvar dermatitis - Primary    Unclear etiology--biopsy pending. Concern for VIN or worse--       Relevant Orders   Surgical pathology( [place]/ [facility])   Return in about 4 weeks (around 03/18/2021), or if symptoms worsen or fail to improve.  03/20/2021 02/19/2021 8:42 AM

## 2021-02-19 ENCOUNTER — Encounter: Payer: Self-pay | Admitting: Family Medicine

## 2021-02-19 DIAGNOSIS — L309 Dermatitis, unspecified: Secondary | ICD-10-CM | POA: Insufficient documentation

## 2021-02-19 NOTE — Assessment & Plan Note (Signed)
Unclear etiology--biopsy pending. Concern for VIN or worse--

## 2021-02-23 ENCOUNTER — Encounter: Payer: Medicare PPO | Admitting: Obstetrics & Gynecology

## 2021-02-25 ENCOUNTER — Other Ambulatory Visit: Payer: Self-pay | Admitting: *Deleted

## 2021-02-25 MED ORDER — CLOBETASOL PROPIONATE 0.05 % EX OINT: 1.0000 "application " | TOPICAL_OINTMENT | Freq: Two times a day (BID) | CUTANEOUS | 6 refills | Status: DC

## 2021-02-25 NOTE — Progress Notes (Signed)
Rx sent to wrong pharmacy, resent to [facility]

## 2021-02-25 NOTE — Addendum Note (Signed)
Addended by: Reva Bores on: 02/25/2021 08:11 AM   Modules accepted: Orders

## 2021-03-19 ENCOUNTER — Other Ambulatory Visit: Payer: Self-pay

## 2021-03-19 ENCOUNTER — Encounter: Payer: Self-pay | Admitting: Family Medicine

## 2021-03-19 ENCOUNTER — Ambulatory Visit: Payer: Medicare PPO | Admitting: Family Medicine

## 2021-03-19 VITALS — BP 135/82 | HR 88 | Wt 230.6 lb

## 2021-03-19 DIAGNOSIS — A6004 Herpesviral vulvovaginitis: Secondary | ICD-10-CM | POA: Diagnosis not present

## 2021-03-19 DIAGNOSIS — L309 Dermatitis, unspecified: Secondary | ICD-10-CM | POA: Diagnosis not present

## 2021-03-19 MED ORDER — VALACYCLOVIR HCL 1 G PO TABS: 1000.0000 mg | ORAL_TABLET | Freq: Every day | ORAL | 2 refills | Status: DC

## 2021-03-19 NOTE — Assessment & Plan Note (Signed)
Improved on Temovate. Discussed how to limit exposure.

## 2021-03-19 NOTE — Progress Notes (Signed)
    Subjective:    Patient ID: Shelby Duke is a 70 y.o. 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 Improved on Temovate.  Review of Systems  Constitutional:  Negative for chills and fever.  Respiratory:  Negative for shortness of breath.   Cardiovascular:  Negative for chest pain.  Gastrointestinal:  Negative for abdominal pain, nausea and vomiting.  Genitourinary:  Negative for dysuria.  Skin:  Negative for rash.     Objective:    BP 135/82   Pulse 88   Wt 230 lb 9.6 oz (104.6 kg)   BMI 42.18 kg/m  Physical Exam Constitutional:      General: She is not in acute distress.    Appearance: She is well-developed.  HENT:     Head: Normocephalic and atraumatic.  Eyes:     General: No scleral icterus. Cardiovascular:     Rate and Rhythm: Normal rate.  Pulmonary:     Effort: Pulmonary effort is normal.  Abdominal:     Palpations: Abdomen is soft.  Genitourinary:    Comments: Series of ulcerated lesions on right perineum Musculoskeletal:     Cervical back: Neck supple.  Skin:    General: Skin is warm and dry.  Neurological:     Mental Status: She is alert and oriented to person, place, and time.        Assessment & Plan:   Problem List Items Addressed This Visit       Unprioritized   Vulvar dermatitis    Improved on Temovate. Discussed how to limit exposure.      Herpes simplex vulvovaginitis - Primary    Suspected, based on appearance. Likely worsened with decreasing immune response due to age. Will treat presumptively      Relevant Medications   valACYclovir (VALTREX) 1000 MG tablet    Return if symptoms worsen or fail to improve.  Reva Bores 03/19/2021 1:27 PM

## 2021-03-19 NOTE — Assessment & Plan Note (Addendum)
Suspected, based on appearance. Likely worsened with decreasing immune response due to age. Will treat presumptively

## 2021-03-19 NOTE — Progress Notes (Signed)
Pt c/o reoccurring vaginal bumps.

## 2021-04-08 DIAGNOSIS — E785 Hyperlipidemia, unspecified: Secondary | ICD-10-CM | POA: Diagnosis not present

## 2021-04-08 DIAGNOSIS — I10 Essential (primary) hypertension: Secondary | ICD-10-CM | POA: Diagnosis not present

## 2021-04-08 DIAGNOSIS — E1169 Type 2 diabetes mellitus with other specified complication: Secondary | ICD-10-CM | POA: Diagnosis not present

## 2021-04-10 DIAGNOSIS — Z6841 Body Mass Index (BMI) 40.0 and over, adult: Secondary | ICD-10-CM | POA: Diagnosis not present

## 2021-04-10 DIAGNOSIS — A6000 Herpesviral infection of urogenital system, unspecified: Secondary | ICD-10-CM | POA: Diagnosis not present

## 2021-04-10 DIAGNOSIS — E785 Hyperlipidemia, unspecified: Secondary | ICD-10-CM | POA: Diagnosis not present

## 2021-04-10 DIAGNOSIS — E1169 Type 2 diabetes mellitus with other specified complication: Secondary | ICD-10-CM | POA: Diagnosis not present

## 2021-04-10 DIAGNOSIS — E6609 Other obesity due to excess calories: Secondary | ICD-10-CM | POA: Diagnosis not present

## 2021-04-10 DIAGNOSIS — Z0000 Encounter for general adult medical examination without abnormal findings: Secondary | ICD-10-CM | POA: Diagnosis not present

## 2021-04-10 DIAGNOSIS — L03315 Cellulitis of perineum: Secondary | ICD-10-CM | POA: Diagnosis not present

## 2021-04-10 DIAGNOSIS — E669 Obesity, unspecified: Secondary | ICD-10-CM | POA: Diagnosis not present

## 2021-04-10 DIAGNOSIS — Z113 Encounter for screening for infections with a predominantly sexual mode of transmission: Secondary | ICD-10-CM | POA: Diagnosis not present

## 2021-04-10 DIAGNOSIS — I10 Essential (primary) hypertension: Secondary | ICD-10-CM | POA: Diagnosis not present

## 2021-05-15 DIAGNOSIS — M25561 Pain in right knee: Secondary | ICD-10-CM | POA: Diagnosis not present

## 2021-05-18 DIAGNOSIS — Z961 Presence of intraocular lens: Secondary | ICD-10-CM | POA: Diagnosis not present

## 2021-05-18 DIAGNOSIS — H10413 Chronic giant papillary conjunctivitis, bilateral: Secondary | ICD-10-CM | POA: Diagnosis not present

## 2021-05-18 DIAGNOSIS — H26492 Other secondary cataract, left eye: Secondary | ICD-10-CM | POA: Diagnosis not present

## 2021-05-18 DIAGNOSIS — E119 Type 2 diabetes mellitus without complications: Secondary | ICD-10-CM | POA: Diagnosis not present

## 2021-07-08 DIAGNOSIS — E1169 Type 2 diabetes mellitus with other specified complication: Secondary | ICD-10-CM | POA: Diagnosis not present

## 2021-07-08 DIAGNOSIS — I10 Essential (primary) hypertension: Secondary | ICD-10-CM | POA: Diagnosis not present

## 2021-07-08 DIAGNOSIS — E785 Hyperlipidemia, unspecified: Secondary | ICD-10-CM | POA: Diagnosis not present

## 2021-07-10 DIAGNOSIS — E1169 Type 2 diabetes mellitus with other specified complication: Secondary | ICD-10-CM | POA: Diagnosis not present

## 2021-07-10 DIAGNOSIS — E6609 Other obesity due to excess calories: Secondary | ICD-10-CM | POA: Diagnosis not present

## 2021-07-10 DIAGNOSIS — I10 Essential (primary) hypertension: Secondary | ICD-10-CM | POA: Diagnosis not present

## 2021-07-10 DIAGNOSIS — Z6841 Body Mass Index (BMI) 40.0 and over, adult: Secondary | ICD-10-CM | POA: Diagnosis not present

## 2021-07-10 DIAGNOSIS — E781 Pure hyperglyceridemia: Secondary | ICD-10-CM | POA: Diagnosis not present

## 2021-11-06 DIAGNOSIS — E1169 Type 2 diabetes mellitus with other specified complication: Secondary | ICD-10-CM | POA: Diagnosis not present

## 2021-11-06 DIAGNOSIS — I10 Essential (primary) hypertension: Secondary | ICD-10-CM | POA: Diagnosis not present

## 2021-11-06 DIAGNOSIS — E785 Hyperlipidemia, unspecified: Secondary | ICD-10-CM | POA: Diagnosis not present

## 2021-11-09 DIAGNOSIS — E785 Hyperlipidemia, unspecified: Secondary | ICD-10-CM | POA: Diagnosis not present

## 2021-11-09 DIAGNOSIS — E1169 Type 2 diabetes mellitus with other specified complication: Secondary | ICD-10-CM | POA: Diagnosis not present

## 2021-11-09 DIAGNOSIS — E669 Obesity, unspecified: Secondary | ICD-10-CM | POA: Diagnosis not present

## 2021-12-02 DIAGNOSIS — Z23 Encounter for immunization: Secondary | ICD-10-CM | POA: Diagnosis not present

## 2021-12-28 DIAGNOSIS — Z780 Asymptomatic menopausal state: Secondary | ICD-10-CM | POA: Diagnosis not present

## 2021-12-28 DIAGNOSIS — Z1231 Encounter for screening mammogram for malignant neoplasm of breast: Secondary | ICD-10-CM | POA: Diagnosis not present

## 2022-02-15 DIAGNOSIS — E785 Hyperlipidemia, unspecified: Secondary | ICD-10-CM | POA: Diagnosis not present

## 2022-02-15 DIAGNOSIS — Z7985 Long-term (current) use of injectable non-insulin antidiabetic drugs: Secondary | ICD-10-CM | POA: Diagnosis not present

## 2022-02-15 DIAGNOSIS — Z8249 Family history of ischemic heart disease and other diseases of the circulatory system: Secondary | ICD-10-CM | POA: Diagnosis not present

## 2022-02-15 DIAGNOSIS — Z6839 Body mass index (BMI) 39.0-39.9, adult: Secondary | ICD-10-CM | POA: Diagnosis not present

## 2022-02-15 DIAGNOSIS — R32 Unspecified urinary incontinence: Secondary | ICD-10-CM | POA: Diagnosis not present

## 2022-02-15 DIAGNOSIS — Z818 Family history of other mental and behavioral disorders: Secondary | ICD-10-CM | POA: Diagnosis not present

## 2022-02-15 DIAGNOSIS — E119 Type 2 diabetes mellitus without complications: Secondary | ICD-10-CM | POA: Diagnosis not present

## 2022-02-15 DIAGNOSIS — I10 Essential (primary) hypertension: Secondary | ICD-10-CM | POA: Diagnosis not present

## 2022-02-24 ENCOUNTER — Other Ambulatory Visit (HOSPITAL_BASED_OUTPATIENT_CLINIC_OR_DEPARTMENT_OTHER): Payer: Self-pay

## 2022-02-24 MED ORDER — OZEMPIC (2 MG/DOSE) 8 MG/3ML ~~LOC~~ SOPN
PEN_INJECTOR | SUBCUTANEOUS | 0 refills | Status: DC
  Filled 2022-02-24: qty 3, 28d supply, fill #0
  Filled 2022-03-22: qty 3, 28d supply, fill #1
  Filled 2022-04-21: qty 3, 28d supply, fill #2

## 2022-03-22 ENCOUNTER — Other Ambulatory Visit (HOSPITAL_BASED_OUTPATIENT_CLINIC_OR_DEPARTMENT_OTHER): Payer: Self-pay

## 2022-03-22 DIAGNOSIS — M130 Polyarthritis, unspecified: Secondary | ICD-10-CM | POA: Diagnosis not present

## 2022-03-22 DIAGNOSIS — E785 Hyperlipidemia, unspecified: Secondary | ICD-10-CM | POA: Diagnosis not present

## 2022-03-22 DIAGNOSIS — I10 Essential (primary) hypertension: Secondary | ICD-10-CM | POA: Diagnosis not present

## 2022-03-22 DIAGNOSIS — E669 Obesity, unspecified: Secondary | ICD-10-CM | POA: Diagnosis not present

## 2022-03-22 DIAGNOSIS — E1169 Type 2 diabetes mellitus with other specified complication: Secondary | ICD-10-CM | POA: Diagnosis not present

## 2022-03-22 DIAGNOSIS — E1165 Type 2 diabetes mellitus with hyperglycemia: Secondary | ICD-10-CM | POA: Diagnosis not present

## 2022-03-22 DIAGNOSIS — Z6839 Body mass index (BMI) 39.0-39.9, adult: Secondary | ICD-10-CM | POA: Diagnosis not present

## 2022-04-21 ENCOUNTER — Other Ambulatory Visit (HOSPITAL_BASED_OUTPATIENT_CLINIC_OR_DEPARTMENT_OTHER): Payer: Self-pay

## 2022-05-10 ENCOUNTER — Other Ambulatory Visit: Payer: Self-pay | Admitting: Family Medicine

## 2022-05-13 ENCOUNTER — Other Ambulatory Visit: Payer: Self-pay | Admitting: Family Medicine

## 2022-05-13 DIAGNOSIS — A6004 Herpesviral vulvovaginitis: Secondary | ICD-10-CM

## 2022-05-19 DIAGNOSIS — H26492 Other secondary cataract, left eye: Secondary | ICD-10-CM | POA: Diagnosis not present

## 2022-05-19 DIAGNOSIS — Z961 Presence of intraocular lens: Secondary | ICD-10-CM | POA: Diagnosis not present

## 2022-05-19 DIAGNOSIS — E119 Type 2 diabetes mellitus without complications: Secondary | ICD-10-CM | POA: Diagnosis not present

## 2022-05-19 DIAGNOSIS — H10413 Chronic giant papillary conjunctivitis, bilateral: Secondary | ICD-10-CM | POA: Diagnosis not present

## 2022-05-25 DIAGNOSIS — Z0000 Encounter for general adult medical examination without abnormal findings: Secondary | ICD-10-CM | POA: Diagnosis not present

## 2022-05-25 DIAGNOSIS — E6609 Other obesity due to excess calories: Secondary | ICD-10-CM | POA: Diagnosis not present

## 2022-05-25 DIAGNOSIS — R947 Abnormal results of other endocrine function studies: Secondary | ICD-10-CM | POA: Diagnosis not present

## 2022-05-25 DIAGNOSIS — E1169 Type 2 diabetes mellitus with other specified complication: Secondary | ICD-10-CM | POA: Diagnosis not present

## 2022-05-26 DIAGNOSIS — E785 Hyperlipidemia, unspecified: Secondary | ICD-10-CM | POA: Diagnosis not present

## 2022-05-26 DIAGNOSIS — R947 Abnormal results of other endocrine function studies: Secondary | ICD-10-CM | POA: Diagnosis not present

## 2022-05-26 DIAGNOSIS — E1169 Type 2 diabetes mellitus with other specified complication: Secondary | ICD-10-CM | POA: Diagnosis not present

## 2022-05-26 DIAGNOSIS — M130 Polyarthritis, unspecified: Secondary | ICD-10-CM | POA: Diagnosis not present

## 2022-05-26 DIAGNOSIS — I10 Essential (primary) hypertension: Secondary | ICD-10-CM | POA: Diagnosis not present

## 2022-07-06 DIAGNOSIS — E1169 Type 2 diabetes mellitus with other specified complication: Secondary | ICD-10-CM | POA: Diagnosis not present

## 2022-07-06 DIAGNOSIS — I10 Essential (primary) hypertension: Secondary | ICD-10-CM | POA: Diagnosis not present

## 2022-07-06 DIAGNOSIS — E782 Mixed hyperlipidemia: Secondary | ICD-10-CM | POA: Diagnosis not present

## 2022-07-08 ENCOUNTER — Other Ambulatory Visit (HOSPITAL_BASED_OUTPATIENT_CLINIC_OR_DEPARTMENT_OTHER): Payer: Self-pay

## 2022-07-08 DIAGNOSIS — E6609 Other obesity due to excess calories: Secondary | ICD-10-CM | POA: Diagnosis not present

## 2022-07-08 DIAGNOSIS — E785 Hyperlipidemia, unspecified: Secondary | ICD-10-CM | POA: Diagnosis not present

## 2022-07-08 DIAGNOSIS — E1169 Type 2 diabetes mellitus with other specified complication: Secondary | ICD-10-CM | POA: Diagnosis not present

## 2022-07-08 DIAGNOSIS — I10 Essential (primary) hypertension: Secondary | ICD-10-CM | POA: Diagnosis not present

## 2022-07-08 MED ORDER — OZEMPIC (2 MG/DOSE) 8 MG/3ML ~~LOC~~ SOPN
2.0000 mg | PEN_INJECTOR | SUBCUTANEOUS | 0 refills | Status: DC
  Filled 2022-07-08: qty 9, 84d supply, fill #0

## 2022-07-09 ENCOUNTER — Other Ambulatory Visit (HOSPITAL_BASED_OUTPATIENT_CLINIC_OR_DEPARTMENT_OTHER): Payer: Self-pay

## 2022-08-28 ENCOUNTER — Other Ambulatory Visit: Payer: Self-pay | Admitting: Family Medicine

## 2022-08-28 DIAGNOSIS — A6004 Herpesviral vulvovaginitis: Secondary | ICD-10-CM

## 2022-08-28 MED ORDER — CLOBETASOL PROPIONATE 0.05 % EX OINT: 1.0000 | TOPICAL_OINTMENT | Freq: Two times a day (BID) | CUTANEOUS | 6 refills | Status: DC

## 2022-08-28 MED ORDER — VALACYCLOVIR HCL 1 G PO TABS: 1000.0000 mg | ORAL_TABLET | Freq: Every day | ORAL | 2 refills | Status: DC

## 2022-09-03 DIAGNOSIS — E781 Pure hyperglyceridemia: Secondary | ICD-10-CM | POA: Diagnosis not present

## 2022-09-03 DIAGNOSIS — E1169 Type 2 diabetes mellitus with other specified complication: Secondary | ICD-10-CM | POA: Diagnosis not present

## 2022-09-03 DIAGNOSIS — I10 Essential (primary) hypertension: Secondary | ICD-10-CM | POA: Diagnosis not present

## 2022-09-09 IMAGING — MG MM DIGITAL SCREENING BILAT W/ TOMO AND CAD
8 of 17 series · 8 of 40 positions shown · non-contrast
Comparison: Previous exam(s).

ACR Breast Density Category a: The breast tissue is almost entirely
fatty.

CLINICAL DATA: Screening.

EXAM:
DIGITAL SCREENING BILATERAL MAMMOGRAM WITH TOMOSYNTHESIS AND CAD
TECHNIQUE: Bilateral screening digital craniocaudal and mediolateral oblique
mammograms were obtained. Bilateral screening digital breast
tomosynthesis was performed. The images were evaluated with
computer-aided detection.

[R MLO synth-2D (1 of 2)]
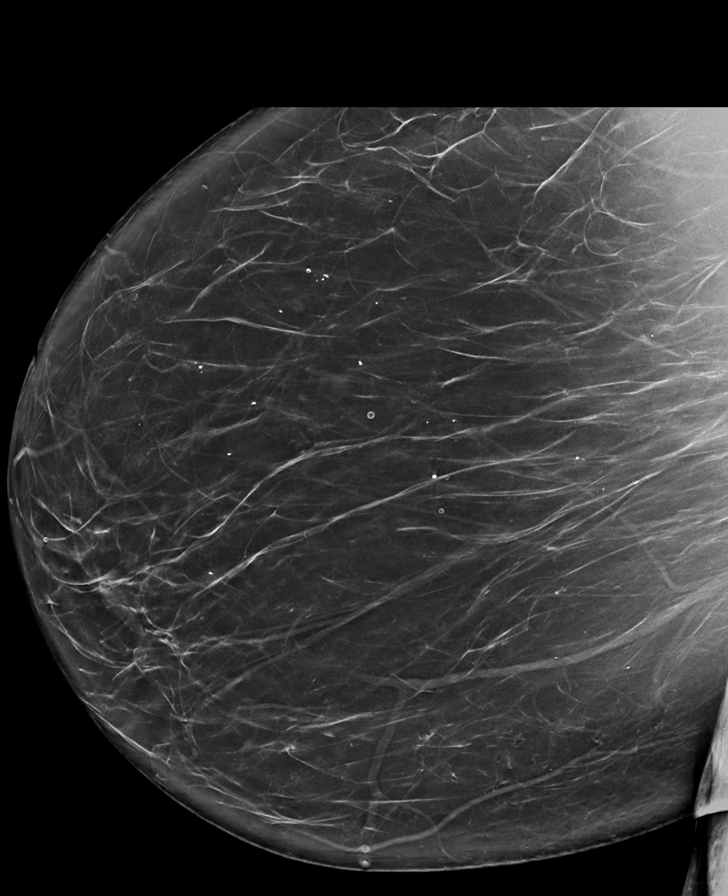

[R MLO synth-2D (2 of 2)]
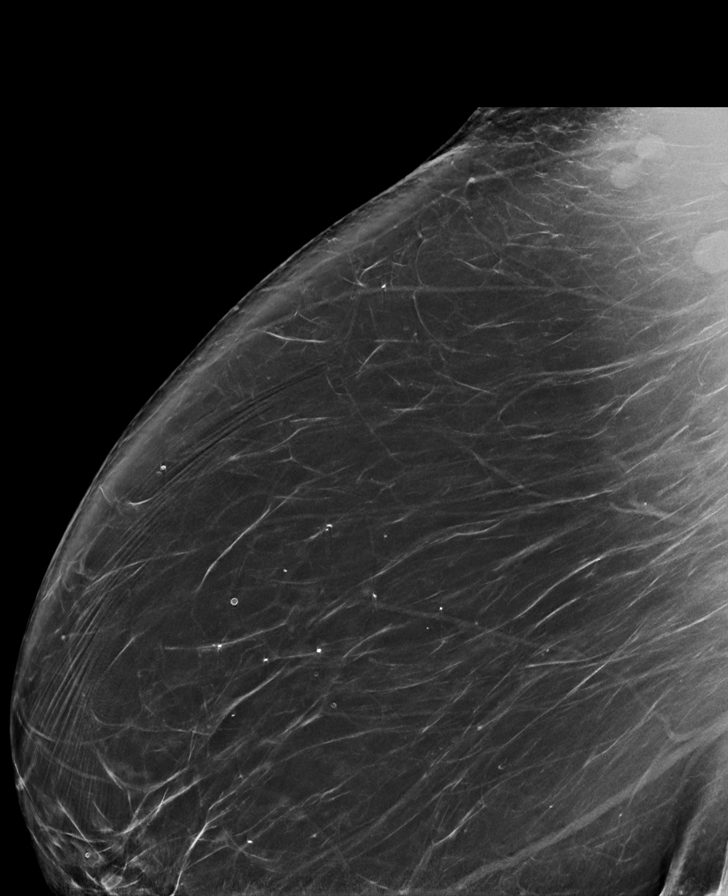

[L MLO synth-2D (1 of 2)]
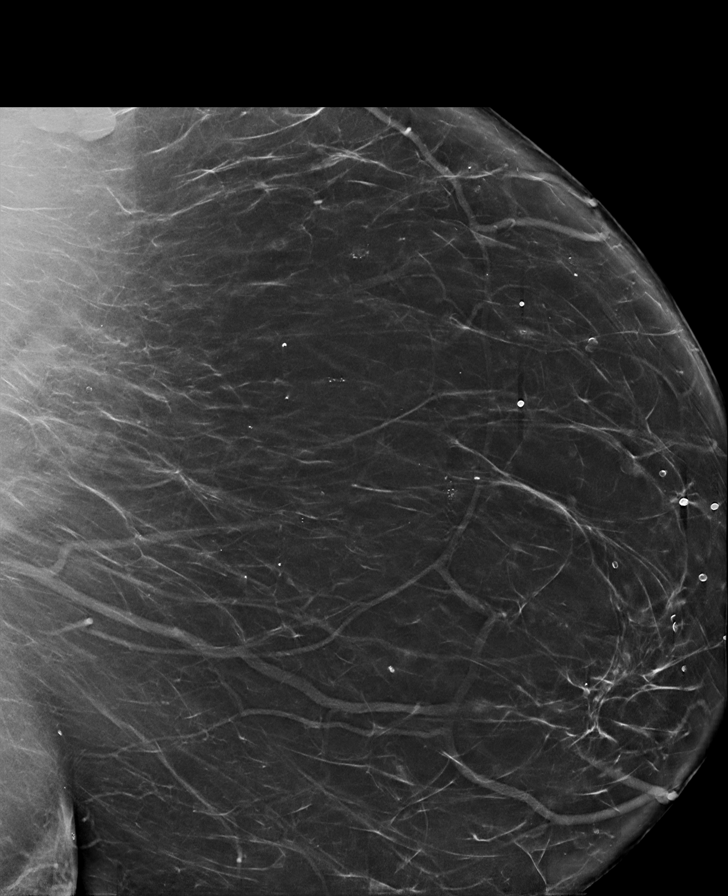

[L CC synth-2D (1 of 2)]
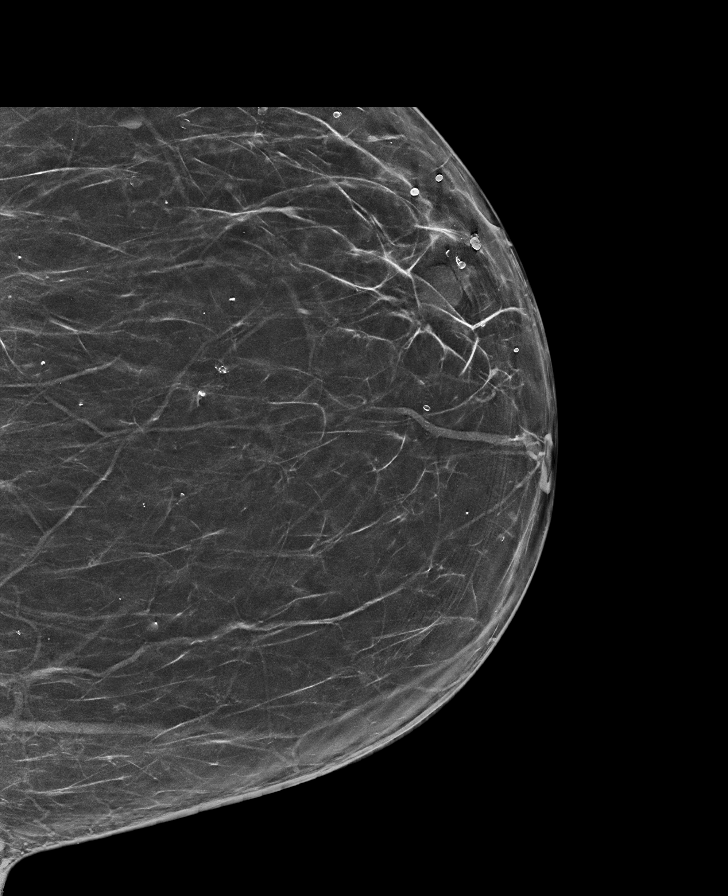

[L CC synth-2D (2 of 2)]
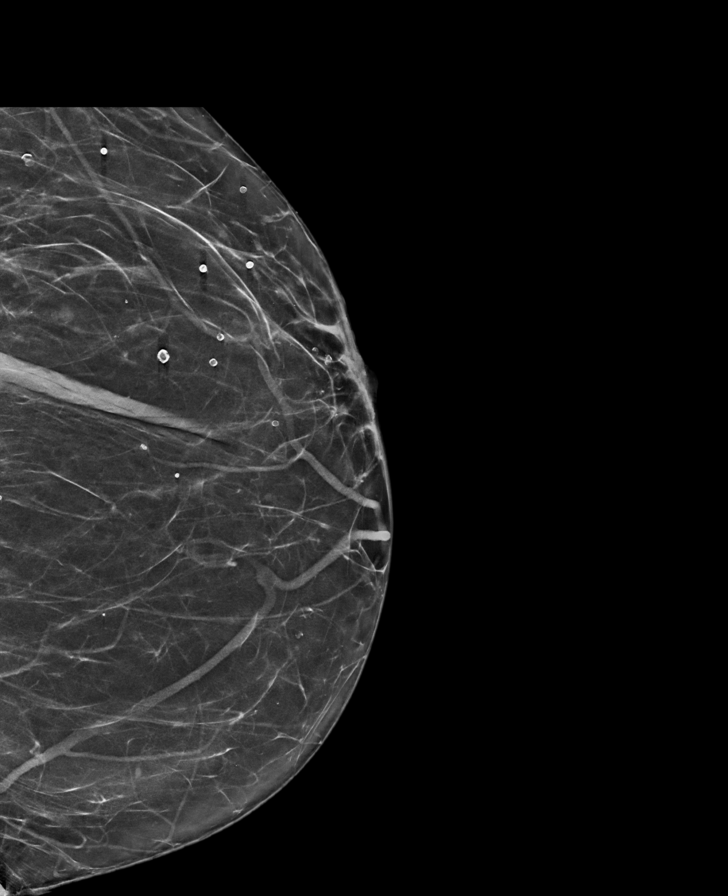

[L MLO synth-2D (2 of 2)]
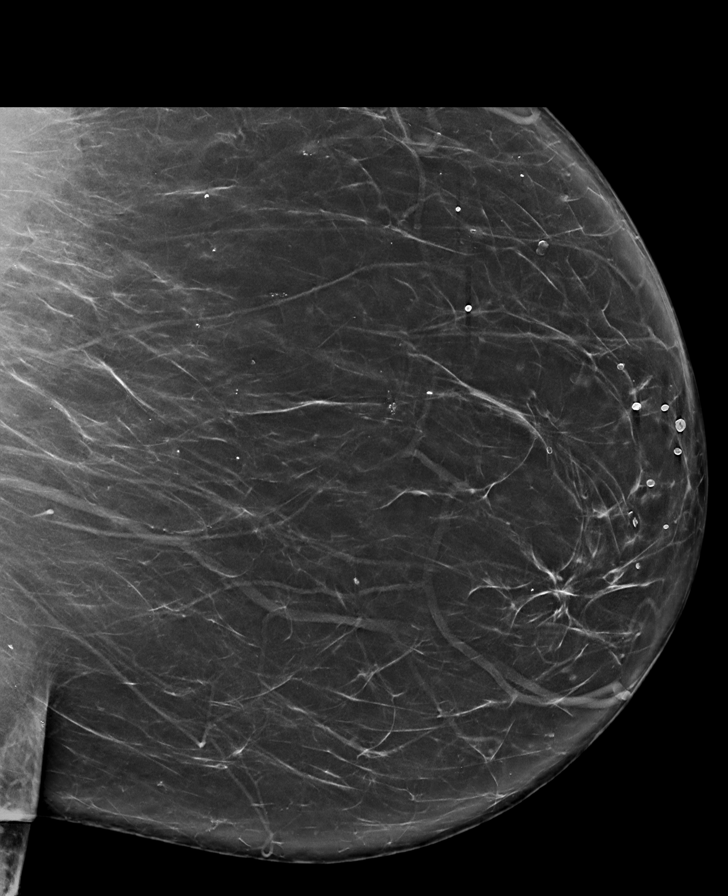

[R CC synth-2D]
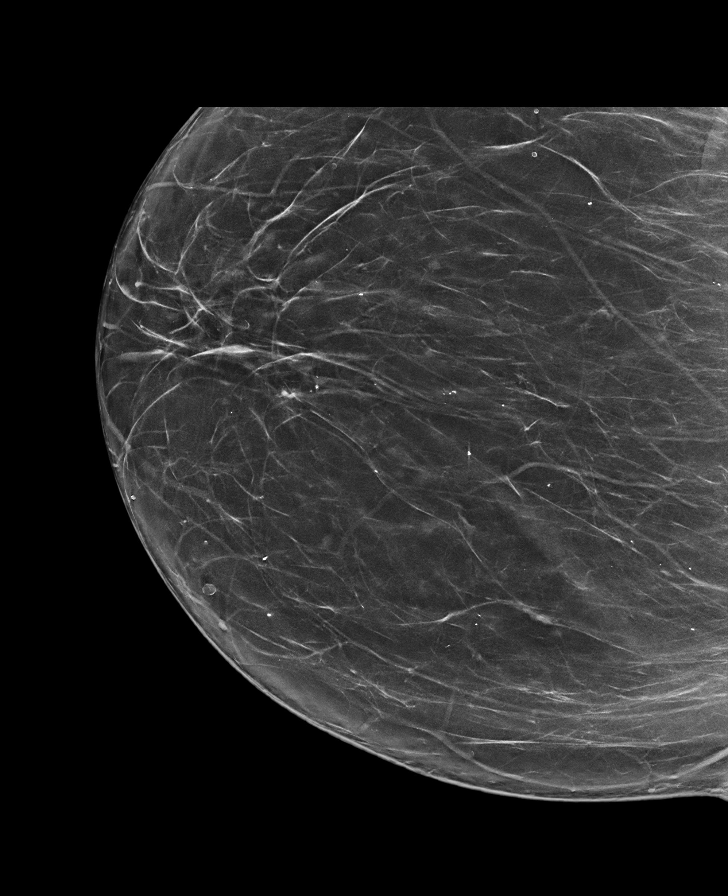

[L MLO tomo · tomo slice 47/94.0]
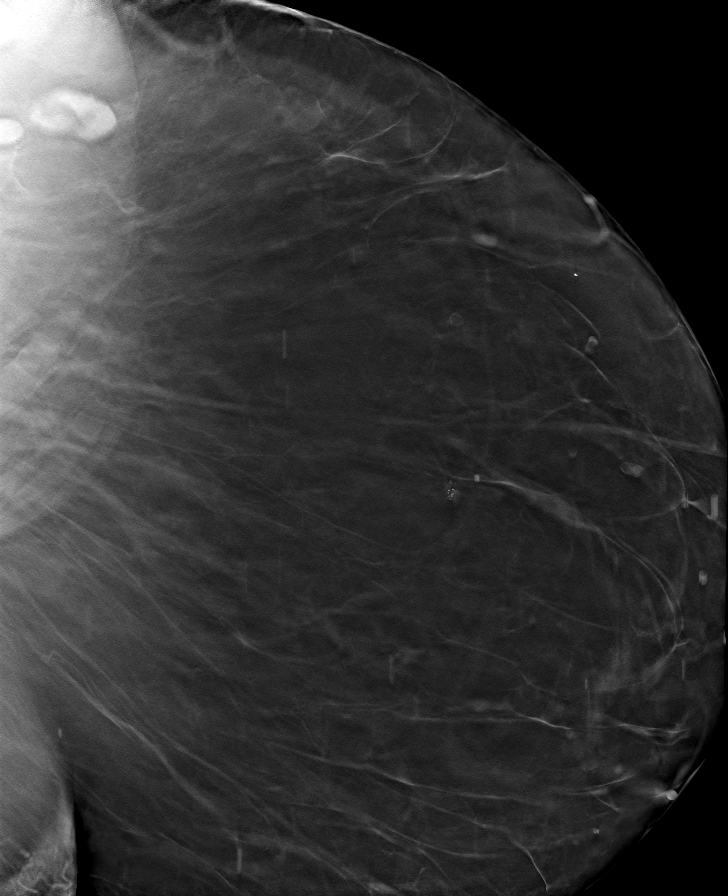

[8 of 40 positions shown; findings below may reference images not displayed]

FINDINGS: There are no findings suspicious for malignancy. The images were
evaluated with computer-aided detection.
IMPRESSION: No mammographic evidence of malignancy. A result letter of this
screening mammogram will be mailed directly to the patient.

RECOMMENDATION:
Screening mammogram in one year. (Code:JP-J-DD5)

BI-RADS CATEGORY  1: Negative.

## 2022-10-02 ENCOUNTER — Other Ambulatory Visit (HOSPITAL_BASED_OUTPATIENT_CLINIC_OR_DEPARTMENT_OTHER): Payer: Self-pay

## 2022-10-04 MED ORDER — SEMAGLUTIDE (2 MG/DOSE) 8 MG/3ML ~~LOC~~ SOPN
2.0000 mg | PEN_INJECTOR | SUBCUTANEOUS | 0 refills | Status: DC
  Filled 2022-10-04: qty 9, 84d supply, fill #0

## 2022-10-05 ENCOUNTER — Other Ambulatory Visit (HOSPITAL_BASED_OUTPATIENT_CLINIC_OR_DEPARTMENT_OTHER): Payer: Self-pay

## 2022-10-15 DIAGNOSIS — M25561 Pain in right knee: Secondary | ICD-10-CM | POA: Diagnosis not present

## 2022-12-15 DIAGNOSIS — E1169 Type 2 diabetes mellitus with other specified complication: Secondary | ICD-10-CM | POA: Diagnosis not present

## 2022-12-15 DIAGNOSIS — E781 Pure hyperglyceridemia: Secondary | ICD-10-CM | POA: Diagnosis not present

## 2022-12-15 DIAGNOSIS — E6609 Other obesity due to excess calories: Secondary | ICD-10-CM | POA: Diagnosis not present

## 2022-12-15 DIAGNOSIS — I10 Essential (primary) hypertension: Secondary | ICD-10-CM | POA: Diagnosis not present

## 2022-12-17 DIAGNOSIS — F509 Eating disorder, unspecified: Secondary | ICD-10-CM | POA: Diagnosis not present

## 2022-12-17 DIAGNOSIS — E1169 Type 2 diabetes mellitus with other specified complication: Secondary | ICD-10-CM | POA: Diagnosis not present

## 2022-12-17 DIAGNOSIS — I10 Essential (primary) hypertension: Secondary | ICD-10-CM | POA: Diagnosis not present

## 2022-12-25 ENCOUNTER — Other Ambulatory Visit (HOSPITAL_COMMUNITY): Payer: Self-pay

## 2022-12-25 MED ORDER — SEMAGLUTIDE (2 MG/DOSE) 8 MG/3ML ~~LOC~~ SOPN
2.0000 mg | PEN_INJECTOR | SUBCUTANEOUS | 0 refills | Status: DC
  Filled 2022-12-25 – 2022-12-27 (×2): qty 9, 84d supply, fill #0

## 2022-12-27 ENCOUNTER — Other Ambulatory Visit (HOSPITAL_BASED_OUTPATIENT_CLINIC_OR_DEPARTMENT_OTHER): Payer: Self-pay

## 2022-12-28 ENCOUNTER — Other Ambulatory Visit (HOSPITAL_BASED_OUTPATIENT_CLINIC_OR_DEPARTMENT_OTHER): Payer: Self-pay

## 2022-12-31 ENCOUNTER — Other Ambulatory Visit (HOSPITAL_COMMUNITY): Payer: Self-pay

## 2023-01-03 DIAGNOSIS — Z1231 Encounter for screening mammogram for malignant neoplasm of breast: Secondary | ICD-10-CM | POA: Diagnosis not present

## 2023-03-18 ENCOUNTER — Other Ambulatory Visit (HOSPITAL_BASED_OUTPATIENT_CLINIC_OR_DEPARTMENT_OTHER): Payer: Self-pay

## 2023-03-22 ENCOUNTER — Other Ambulatory Visit (HOSPITAL_BASED_OUTPATIENT_CLINIC_OR_DEPARTMENT_OTHER): Payer: Self-pay

## 2023-03-22 MED ORDER — SEMAGLUTIDE (2 MG/DOSE) 8 MG/3ML ~~LOC~~ SOPN
2.0000 mg | PEN_INJECTOR | SUBCUTANEOUS | 0 refills | Status: DC
  Filled 2023-03-22: qty 9, 84d supply, fill #0

## 2023-03-23 ENCOUNTER — Other Ambulatory Visit (HOSPITAL_BASED_OUTPATIENT_CLINIC_OR_DEPARTMENT_OTHER): Payer: Self-pay

## 2023-03-31 DIAGNOSIS — E1169 Type 2 diabetes mellitus with other specified complication: Secondary | ICD-10-CM | POA: Diagnosis not present

## 2023-03-31 DIAGNOSIS — E782 Mixed hyperlipidemia: Secondary | ICD-10-CM | POA: Diagnosis not present

## 2023-03-31 DIAGNOSIS — E559 Vitamin D deficiency, unspecified: Secondary | ICD-10-CM | POA: Diagnosis not present

## 2023-03-31 DIAGNOSIS — I10 Essential (primary) hypertension: Secondary | ICD-10-CM | POA: Diagnosis not present

## 2023-04-21 DIAGNOSIS — E1169 Type 2 diabetes mellitus with other specified complication: Secondary | ICD-10-CM | POA: Diagnosis not present

## 2023-04-21 DIAGNOSIS — E6609 Other obesity due to excess calories: Secondary | ICD-10-CM | POA: Diagnosis not present

## 2023-04-21 DIAGNOSIS — M130 Polyarthritis, unspecified: Secondary | ICD-10-CM | POA: Diagnosis not present

## 2023-04-21 DIAGNOSIS — F509 Eating disorder, unspecified: Secondary | ICD-10-CM | POA: Diagnosis not present

## 2023-04-21 DIAGNOSIS — I10 Essential (primary) hypertension: Secondary | ICD-10-CM | POA: Diagnosis not present

## 2023-05-13 DIAGNOSIS — E669 Obesity, unspecified: Secondary | ICD-10-CM | POA: Diagnosis not present

## 2023-05-13 DIAGNOSIS — E1169 Type 2 diabetes mellitus with other specified complication: Secondary | ICD-10-CM | POA: Diagnosis not present

## 2023-05-22 ENCOUNTER — Other Ambulatory Visit: Payer: Self-pay | Admitting: Family Medicine

## 2023-05-22 DIAGNOSIS — A6004 Herpesviral vulvovaginitis: Secondary | ICD-10-CM

## 2023-06-09 ENCOUNTER — Other Ambulatory Visit (HOSPITAL_BASED_OUTPATIENT_CLINIC_OR_DEPARTMENT_OTHER): Payer: Self-pay

## 2023-06-09 MED ORDER — SEMAGLUTIDE (2 MG/DOSE) 8 MG/3ML ~~LOC~~ SOPN
2.0000 mg | PEN_INJECTOR | SUBCUTANEOUS | 0 refills | Status: AC
  Filled 2023-06-09: qty 9, 84d supply, fill #0

## 2023-06-10 ENCOUNTER — Other Ambulatory Visit (HOSPITAL_BASED_OUTPATIENT_CLINIC_OR_DEPARTMENT_OTHER): Payer: Self-pay

## 2023-06-10 ENCOUNTER — Other Ambulatory Visit: Payer: Self-pay | Admitting: *Deleted

## 2023-06-10 DIAGNOSIS — A6004 Herpesviral vulvovaginitis: Secondary | ICD-10-CM

## 2023-06-10 MED ORDER — VALACYCLOVIR HCL 1 G PO TABS: 1000.0000 mg | ORAL_TABLET | Freq: Every day | ORAL | 0 refills | Status: DC

## 2023-06-20 DIAGNOSIS — E1169 Type 2 diabetes mellitus with other specified complication: Secondary | ICD-10-CM | POA: Diagnosis not present

## 2023-06-20 DIAGNOSIS — F509 Eating disorder, unspecified: Secondary | ICD-10-CM | POA: Diagnosis not present

## 2023-06-20 DIAGNOSIS — I10 Essential (primary) hypertension: Secondary | ICD-10-CM | POA: Diagnosis not present

## 2023-08-05 DIAGNOSIS — M25561 Pain in right knee: Secondary | ICD-10-CM | POA: Diagnosis not present

## 2023-09-06 ENCOUNTER — Other Ambulatory Visit: Payer: Self-pay | Admitting: Family Medicine

## 2023-09-06 DIAGNOSIS — A6004 Herpesviral vulvovaginitis: Secondary | ICD-10-CM

## 2023-09-08 ENCOUNTER — Other Ambulatory Visit (HOSPITAL_COMMUNITY): Payer: Self-pay

## 2023-09-12 ENCOUNTER — Other Ambulatory Visit (HOSPITAL_BASED_OUTPATIENT_CLINIC_OR_DEPARTMENT_OTHER): Payer: Self-pay

## 2023-09-16 DIAGNOSIS — E6609 Other obesity due to excess calories: Secondary | ICD-10-CM | POA: Diagnosis not present

## 2023-09-16 DIAGNOSIS — E785 Hyperlipidemia, unspecified: Secondary | ICD-10-CM | POA: Diagnosis not present

## 2023-09-16 DIAGNOSIS — I10 Essential (primary) hypertension: Secondary | ICD-10-CM | POA: Diagnosis not present

## 2023-09-16 DIAGNOSIS — E1169 Type 2 diabetes mellitus with other specified complication: Secondary | ICD-10-CM | POA: Diagnosis not present

## 2023-09-26 DIAGNOSIS — Z961 Presence of intraocular lens: Secondary | ICD-10-CM | POA: Diagnosis not present

## 2023-09-26 DIAGNOSIS — H26492 Other secondary cataract, left eye: Secondary | ICD-10-CM | POA: Diagnosis not present

## 2023-09-26 DIAGNOSIS — H10413 Chronic giant papillary conjunctivitis, bilateral: Secondary | ICD-10-CM | POA: Diagnosis not present

## 2023-09-26 DIAGNOSIS — E119 Type 2 diabetes mellitus without complications: Secondary | ICD-10-CM | POA: Diagnosis not present

## 2023-10-05 ENCOUNTER — Other Ambulatory Visit (HOSPITAL_COMMUNITY): Payer: Self-pay

## 2023-12-16 DIAGNOSIS — E118 Type 2 diabetes mellitus with unspecified complications: Secondary | ICD-10-CM | POA: Diagnosis not present

## 2023-12-16 DIAGNOSIS — E1165 Type 2 diabetes mellitus with hyperglycemia: Secondary | ICD-10-CM | POA: Diagnosis not present

## 2023-12-16 DIAGNOSIS — M130 Polyarthritis, unspecified: Secondary | ICD-10-CM | POA: Diagnosis not present

## 2023-12-16 DIAGNOSIS — I10 Essential (primary) hypertension: Secondary | ICD-10-CM | POA: Diagnosis not present

## 2023-12-16 DIAGNOSIS — E1169 Type 2 diabetes mellitus with other specified complication: Secondary | ICD-10-CM | POA: Diagnosis not present

## 2023-12-16 DIAGNOSIS — Z6837 Body mass index (BMI) 37.0-37.9, adult: Secondary | ICD-10-CM | POA: Diagnosis not present

## 2024-01-09 DIAGNOSIS — Z1231 Encounter for screening mammogram for malignant neoplasm of breast: Secondary | ICD-10-CM | POA: Diagnosis not present

## 2024-02-20 DIAGNOSIS — M25561 Pain in right knee: Secondary | ICD-10-CM | POA: Diagnosis not present

## 2024-03-21 ENCOUNTER — Other Ambulatory Visit: Payer: Self-pay | Admitting: Family Medicine

## 2024-03-28 ENCOUNTER — Telehealth: Payer: Self-pay

## 2024-03-28 NOTE — Telephone Encounter (Signed)
 Pt left voicemail stating she needs [facility] [address] [facility] .  She states the pharmacy has attempted to contact office multiple times.  [facility] patient.   [place], RN

## 2024-03-29 ENCOUNTER — Telehealth: Payer: Self-pay

## 2024-03-29 NOTE — Telephone Encounter (Signed)
 Left message for pt to call office back to get scheduled for her refill request.

## 2024-05-02 DIAGNOSIS — M130 Polyarthritis, unspecified: Secondary | ICD-10-CM | POA: Diagnosis not present

## 2024-05-02 DIAGNOSIS — E1165 Type 2 diabetes mellitus with hyperglycemia: Secondary | ICD-10-CM | POA: Diagnosis not present

## 2024-05-02 DIAGNOSIS — E118 Type 2 diabetes mellitus with unspecified complications: Secondary | ICD-10-CM | POA: Diagnosis not present

## 2024-05-04 ENCOUNTER — Encounter: Payer: Self-pay | Admitting: Family Medicine

## 2024-05-04 ENCOUNTER — Ambulatory Visit: Admitting: Family Medicine

## 2024-05-04 VITALS — BP 120/70 | HR 80 | Wt 215.0 lb

## 2024-05-04 DIAGNOSIS — L309 Dermatitis, unspecified: Secondary | ICD-10-CM | POA: Diagnosis not present

## 2024-05-04 DIAGNOSIS — A6004 Herpesviral vulvovaginitis: Secondary | ICD-10-CM | POA: Diagnosis not present

## 2024-05-04 MED ORDER — CLOBETASOL PROPIONATE 0.05 % EX OINT: 1.0000 | TOPICAL_OINTMENT | Freq: Two times a day (BID) | CUTANEOUS | 6 refills | Status: AC

## 2024-05-04 MED ORDER — VALACYCLOVIR HCL 1 G PO TABS: 1000.0000 mg | ORAL_TABLET | Freq: Every day | ORAL | 0 refills | Status: AC

## 2024-05-04 NOTE — Progress Notes (Signed)
   Subjective:    Patient ID: Shelby Duke is a 73 y.o. female presenting with Medication Refill  on 05/04/2024  HPI: Here for medication refill. Has h/o HSV and lichen simplex chronicus. Needs Valtrex  and temovate . Using temovate  2x/month.  Review of Systems  Constitutional:  Negative for chills and fever.  Respiratory:  Negative for shortness of breath.   Cardiovascular:  Negative for chest pain.  Gastrointestinal:  Negative for abdominal pain, nausea and vomiting.  Genitourinary:  Negative for dysuria.  Skin:  Negative for rash.      Objective:    BP 120/70   Pulse 80   Wt 215 lb (97.5 kg)   BMI 39.32 kg/m  Physical Exam Exam conducted with a chaperone present.  Constitutional:      General: She is not in acute distress.    Appearance: She is well-developed.  HENT:     Head: Normocephalic and atraumatic.  Eyes:     General: No scleral icterus. Cardiovascular:     Rate and Rhythm: Normal rate.  Pulmonary:     Effort: Pulmonary effort is normal.  Abdominal:     Palpations: Abdomen is soft.  Musculoskeletal:     Cervical back: Neck supple.  Skin:    General: Skin is warm and dry.  Neurological:     Mental Status: She is alert and oriented to person, place, and time.         Assessment & Plan:   Problem List Items Addressed This Visit       Unprioritized   Vulvar dermatitis - Primary   Lichen simplex on temovate . Rx sent in.      Relevant Medications   clobetasol  ointment (TEMOVATE ) 0.05 %   Herpes simplex vulvovaginitis   Refilled her Valtrex       Relevant Medications   valACYclovir  (VALTREX ) 1000 MG tablet    Return in about 1 year (around 05/04/2025), or if symptoms worsen or fail to improve.  Glenys GORMAN Birk, MD 05/04/2024 11:04 AM

## 2024-05-04 NOTE — Assessment & Plan Note (Signed)
 Lichen simplex on temovate . Rx sent in.

## 2024-05-04 NOTE — Assessment & Plan Note (Signed)
Refilled her Valtrex 

## 2024-05-10 DIAGNOSIS — K5900 Constipation, unspecified: Secondary | ICD-10-CM | POA: Diagnosis not present

## 2024-05-10 DIAGNOSIS — I10 Essential (primary) hypertension: Secondary | ICD-10-CM | POA: Diagnosis not present

## 2024-05-10 DIAGNOSIS — Z0000 Encounter for general adult medical examination without abnormal findings: Secondary | ICD-10-CM | POA: Diagnosis not present

## 2024-05-10 DIAGNOSIS — F509 Eating disorder, unspecified: Secondary | ICD-10-CM | POA: Diagnosis not present

## 2024-05-10 DIAGNOSIS — E1165 Type 2 diabetes mellitus with hyperglycemia: Secondary | ICD-10-CM | POA: Diagnosis not present

## 2024-06-04 DIAGNOSIS — M25561 Pain in right knee: Secondary | ICD-10-CM | POA: Diagnosis not present

## 2024-06-06 DIAGNOSIS — N182 Chronic kidney disease, stage 2 (mild): Secondary | ICD-10-CM | POA: Diagnosis not present

## 2024-06-06 DIAGNOSIS — Z833 Family history of diabetes mellitus: Secondary | ICD-10-CM | POA: Diagnosis not present

## 2024-06-06 DIAGNOSIS — Z8249 Family history of ischemic heart disease and other diseases of the circulatory system: Secondary | ICD-10-CM | POA: Diagnosis not present

## 2024-06-06 DIAGNOSIS — E1122 Type 2 diabetes mellitus with diabetic chronic kidney disease: Secondary | ICD-10-CM | POA: Diagnosis not present

## 2024-06-06 DIAGNOSIS — B009 Herpesviral infection, unspecified: Secondary | ICD-10-CM | POA: Diagnosis not present

## 2024-06-06 DIAGNOSIS — E785 Hyperlipidemia, unspecified: Secondary | ICD-10-CM | POA: Diagnosis not present

## 2024-06-06 DIAGNOSIS — Z809 Family history of malignant neoplasm, unspecified: Secondary | ICD-10-CM | POA: Diagnosis not present

## 2024-06-06 DIAGNOSIS — Z9181 History of falling: Secondary | ICD-10-CM | POA: Diagnosis not present

## 2024-07-29 ENCOUNTER — Other Ambulatory Visit: Payer: Self-pay | Admitting: Family Medicine

## 2024-07-29 DIAGNOSIS — A6004 Herpesviral vulvovaginitis: Secondary | ICD-10-CM
